# Patient Record
Sex: Female | Born: 1980
Health system: Southern US, Community
[De-identification: ages and names within clinical notes are randomized; demographics above are authoritative.]

## PROBLEM LIST (undated history)

## (undated) DIAGNOSIS — F1120 Opioid dependence, uncomplicated: Secondary | ICD-10-CM

## (undated) DIAGNOSIS — H9192 Unspecified hearing loss, left ear: Secondary | ICD-10-CM

## (undated) DIAGNOSIS — F1220 Cannabis dependence, uncomplicated: Secondary | ICD-10-CM

## (undated) DIAGNOSIS — E785 Hyperlipidemia, unspecified: Secondary | ICD-10-CM

## (undated) DIAGNOSIS — M545 Low back pain, unspecified: Secondary | ICD-10-CM

## (undated) DIAGNOSIS — N200 Calculus of kidney: Secondary | ICD-10-CM

## (undated) DIAGNOSIS — I10 Essential (primary) hypertension: Secondary | ICD-10-CM

## (undated) DIAGNOSIS — H6690 Otitis media, unspecified, unspecified ear: Secondary | ICD-10-CM

## (undated) DIAGNOSIS — B1920 Unspecified viral hepatitis C without hepatic coma: Secondary | ICD-10-CM

## (undated) DIAGNOSIS — G8929 Other chronic pain: Secondary | ICD-10-CM

## (undated) HISTORY — PX: ABDOMINAL HYSTERECTOMY: SHX81

## (undated) HISTORY — PX: DILATION AND CURETTAGE OF UTERUS: SHX78

## (undated) HISTORY — PX: TUBAL LIGATION: SHX77

## (undated) HISTORY — DX: Hyperlipidemia, unspecified: E78.5

## (undated) HISTORY — DX: Essential (primary) hypertension: I10

---

## 1999-06-12 ENCOUNTER — Encounter: Admission: RE | Admit: 1999-06-12 | Discharge: 1999-09-10 | Payer: Self-pay | Admitting: Obstetrics & Gynecology

## 1999-07-20 ENCOUNTER — Inpatient Hospital Stay (HOSPITAL_COMMUNITY): Admission: AD | Admit: 1999-07-20 | Discharge: 1999-07-20 | Payer: Self-pay | Admitting: Obstetrics and Gynecology

## 1999-07-21 ENCOUNTER — Inpatient Hospital Stay (HOSPITAL_COMMUNITY): Admission: AD | Admit: 1999-07-21 | Discharge: 1999-07-21 | Payer: Self-pay | Admitting: Obstetrics & Gynecology

## 1999-07-26 ENCOUNTER — Inpatient Hospital Stay (HOSPITAL_COMMUNITY): Admission: AD | Admit: 1999-07-26 | Discharge: 1999-07-26 | Payer: Self-pay | Admitting: Obstetrics & Gynecology

## 1999-08-04 ENCOUNTER — Inpatient Hospital Stay (HOSPITAL_COMMUNITY): Admission: AD | Admit: 1999-08-04 | Discharge: 1999-08-04 | Payer: Self-pay | Admitting: Family Medicine

## 1999-08-05 ENCOUNTER — Inpatient Hospital Stay (HOSPITAL_COMMUNITY): Admission: AD | Admit: 1999-08-05 | Discharge: 1999-08-05 | Payer: Self-pay | Admitting: Obstetrics and Gynecology

## 1999-08-16 ENCOUNTER — Inpatient Hospital Stay (HOSPITAL_COMMUNITY): Admission: AD | Admit: 1999-08-16 | Discharge: 1999-08-16 | Payer: Self-pay | Admitting: Obstetrics and Gynecology

## 1999-08-19 ENCOUNTER — Inpatient Hospital Stay (HOSPITAL_COMMUNITY): Admission: AD | Admit: 1999-08-19 | Discharge: 1999-08-22 | Payer: Self-pay | Admitting: Obstetrics & Gynecology

## 2010-03-26 ENCOUNTER — Emergency Department (HOSPITAL_BASED_OUTPATIENT_CLINIC_OR_DEPARTMENT_OTHER): Admission: EM | Admit: 2010-03-26 | Discharge: 2010-03-26 | Payer: Self-pay | Admitting: Emergency Medicine

## 2010-07-19 ENCOUNTER — Emergency Department (HOSPITAL_BASED_OUTPATIENT_CLINIC_OR_DEPARTMENT_OTHER): Admission: EM | Admit: 2010-07-19 | Discharge: 2010-07-19 | Payer: Self-pay | Admitting: Emergency Medicine

## 2010-09-12 ENCOUNTER — Ambulatory Visit: Payer: Self-pay | Admitting: Diagnostic Radiology

## 2010-09-12 ENCOUNTER — Emergency Department (HOSPITAL_BASED_OUTPATIENT_CLINIC_OR_DEPARTMENT_OTHER): Admission: EM | Admit: 2010-09-12 | Discharge: 2010-09-12 | Payer: Self-pay | Admitting: Emergency Medicine

## 2011-02-10 LAB — URINALYSIS, ROUTINE W REFLEX MICROSCOPIC
Glucose, UA: NEGATIVE mg/dL
Ketones, ur: NEGATIVE mg/dL
Protein, ur: 30 mg/dL — AB
pH: 6 (ref 5.0–8.0)

## 2011-02-10 LAB — CBC
Platelets: 200 10*3/uL (ref 150–400)
RBC: 4.29 MIL/uL (ref 3.87–5.11)
RDW: 12.8 % (ref 11.5–15.5)
WBC: 12.5 10*3/uL — ABNORMAL HIGH (ref 4.0–10.5)

## 2011-02-10 LAB — BASIC METABOLIC PANEL
BUN: 9 mg/dL (ref 6–23)
Creatinine, Ser: 0.7 mg/dL (ref 0.4–1.2)
GFR calc Af Amer: >60 mL/min (ref >60)
GFR calc non Af Amer: >60 mL/min (ref >60)
Potassium: 3.8 mEq/L (ref 3.5–5.1)

## 2011-02-10 LAB — URINE MICROSCOPIC-ADD ON

## 2011-02-10 LAB — DIFFERENTIAL
Basophils Absolute: 0.1 10*3/uL (ref 0.0–0.1)
Lymphocytes Relative: 24 % (ref 12–46)
Lymphs Abs: 3 10*3/uL (ref 0.7–4.0)
Neutro Abs: 8.6 10*3/uL — ABNORMAL HIGH (ref 1.7–7.7)

## 2011-02-12 ENCOUNTER — Emergency Department (HOSPITAL_BASED_OUTPATIENT_CLINIC_OR_DEPARTMENT_OTHER)
Admission: EM | Admit: 2011-02-12 | Discharge: 2011-02-12 | Disposition: A | Discharge status: Home / Self Care | Payer: Self-pay | Attending: Emergency Medicine | Admitting: Emergency Medicine

## 2011-02-12 DIAGNOSIS — J45909 Unspecified asthma, uncomplicated: Secondary | ICD-10-CM | POA: Insufficient documentation

## 2011-02-12 DIAGNOSIS — IMO0002 Reserved for concepts with insufficient information to code with codable children: Secondary | ICD-10-CM | POA: Insufficient documentation

## 2011-02-12 DIAGNOSIS — X500XXA Overexertion from strenuous movement or load, initial encounter: Secondary | ICD-10-CM | POA: Insufficient documentation

## 2011-02-12 DIAGNOSIS — Y92009 Unspecified place in unspecified non-institutional (private) residence as the place of occurrence of the external cause: Secondary | ICD-10-CM | POA: Insufficient documentation

## 2011-03-11 ENCOUNTER — Emergency Department (HOSPITAL_BASED_OUTPATIENT_CLINIC_OR_DEPARTMENT_OTHER)
Admission: EM | Admit: 2011-03-11 | Discharge: 2011-03-11 | Disposition: A | Discharge status: Home / Self Care | Payer: Self-pay | Attending: Emergency Medicine | Admitting: Emergency Medicine

## 2011-03-11 DIAGNOSIS — J45909 Unspecified asthma, uncomplicated: Secondary | ICD-10-CM | POA: Insufficient documentation

## 2011-03-11 DIAGNOSIS — H60399 Other infective otitis externa, unspecified ear: Secondary | ICD-10-CM | POA: Insufficient documentation

## 2011-03-11 DIAGNOSIS — H9209 Otalgia, unspecified ear: Secondary | ICD-10-CM | POA: Insufficient documentation

## 2011-03-11 DIAGNOSIS — F17200 Nicotine dependence, unspecified, uncomplicated: Secondary | ICD-10-CM | POA: Insufficient documentation

## 2011-03-28 ENCOUNTER — Emergency Department (HOSPITAL_BASED_OUTPATIENT_CLINIC_OR_DEPARTMENT_OTHER)
Admission: EM | Admit: 2011-03-28 | Discharge: 2011-03-28 | Disposition: A | Discharge status: Home / Self Care | Payer: Self-pay | Attending: Emergency Medicine | Admitting: Emergency Medicine

## 2011-03-28 DIAGNOSIS — H9209 Otalgia, unspecified ear: Secondary | ICD-10-CM | POA: Insufficient documentation

## 2011-03-28 DIAGNOSIS — F17200 Nicotine dependence, unspecified, uncomplicated: Secondary | ICD-10-CM | POA: Insufficient documentation

## 2011-05-13 ENCOUNTER — Emergency Department (HOSPITAL_BASED_OUTPATIENT_CLINIC_OR_DEPARTMENT_OTHER)
Admission: EM | Admit: 2011-05-13 | Discharge: 2011-05-13 | Disposition: A | Discharge status: Home / Self Care | Payer: Self-pay | Attending: Emergency Medicine | Admitting: Emergency Medicine

## 2011-05-13 DIAGNOSIS — X503XXA Overexertion from repetitive movements, initial encounter: Secondary | ICD-10-CM | POA: Insufficient documentation

## 2011-05-13 DIAGNOSIS — F17200 Nicotine dependence, unspecified, uncomplicated: Secondary | ICD-10-CM | POA: Insufficient documentation

## 2011-05-13 DIAGNOSIS — Y93A9 Activity, other involving cardiorespiratory exercise: Secondary | ICD-10-CM | POA: Insufficient documentation

## 2011-05-13 DIAGNOSIS — S335XXA Sprain of ligaments of lumbar spine, initial encounter: Secondary | ICD-10-CM | POA: Insufficient documentation

## 2011-06-09 ENCOUNTER — Encounter: Payer: Self-pay | Admitting: Family Medicine

## 2011-06-09 ENCOUNTER — Emergency Department (HOSPITAL_BASED_OUTPATIENT_CLINIC_OR_DEPARTMENT_OTHER)
Admission: EM | Admit: 2011-06-09 | Discharge: 2011-06-09 | Disposition: A | Discharge status: Home / Self Care | Payer: Medicaid Other | Attending: Emergency Medicine | Admitting: Emergency Medicine

## 2011-06-09 DIAGNOSIS — J45909 Unspecified asthma, uncomplicated: Secondary | ICD-10-CM | POA: Insufficient documentation

## 2011-06-09 DIAGNOSIS — M545 Low back pain, unspecified: Secondary | ICD-10-CM | POA: Insufficient documentation

## 2011-06-09 DIAGNOSIS — F17200 Nicotine dependence, unspecified, uncomplicated: Secondary | ICD-10-CM | POA: Insufficient documentation

## 2011-06-09 MED ORDER — OXYCODONE-ACETAMINOPHEN 5-325 MG PO TABS: 1.0000 | ORAL_TABLET | ORAL | Status: AC | PRN

## 2011-06-09 MED ORDER — CYCLOBENZAPRINE HCL 10 MG PO TABS: 10.0000 mg | ORAL_TABLET | Freq: Three times a day (TID) | ORAL | Status: AC

## 2011-06-09 NOTE — ED Notes (Signed)
Pt sts she "injured low back pulling on basketball goal". Pt sts she has h/o same.

## 2011-06-09 NOTE — ED Provider Notes (Addendum)
History     Chief Complaint  Patient presents with  . Back Pain   HPI Comments: Pt was pulling a basketball goal last night and hurt her back.  She took ibuprofen and used a heating pad without relief.  She had hurt her back before a month and 1/2 ago.    Patient is a 30 y.o. female presenting with back pain. The history is provided by the patient. No language interpreter was used.  Back Pain  This is a new problem. The current episode started 12 to 24 hours ago. The problem occurs constantly. The problem has not changed since onset.The pain is associated with lifting heavy objects. The pain is present in the lumbar spine. The quality of the pain is described as stabbing. The pain does not radiate. The pain is moderate. The symptoms are aggravated by bending and twisting.    Past Medical History  Diagnosis Date  . Asthma     Past Surgical History  Procedure Date  . Dilation and curettage of uterus     No family history on file.  History  Substance Use Topics  . Smoking status: Current Everyday Smoker -- 1.0 packs/day    Types: Cigarettes  . Smokeless tobacco: Not on file  . Alcohol Use: Yes    OB History    Grav Para Term Preterm Abortions TAB SAB Ect Mult Living                  Review of Systems  Constitutional: Negative.   HENT: Negative.   Eyes: Negative.   Respiratory: Negative.   Cardiovascular: Negative.   Gastrointestinal: Negative.   Genitourinary: Negative.   Musculoskeletal: Positive for back pain.  Skin: Negative.   Neurological: Negative.     Physical Exam  BP 118/71  Pulse 67  Temp(Src) 98.3 F (36.8 C) (Oral)  Ht 5\' 6"  (1.676 m)  Wt 145 lb (65.772 kg)  BMI 23.40 kg/m2  SpO2 99%  Physical Exam  Constitutional: She is oriented to person, place, and time. She appears well-developed. She appears distressed (in moderate distress with back pain localized to the left upper lumbar paraspinous muscles.).  HENT:  Head: Normocephalic and  atraumatic.  Eyes: Conjunctivae and EOM are normal. Pupils are equal, round, and reactive to light.  Neck: Normal range of motion. Neck supple.  Cardiovascular: Normal rate and regular rhythm.   Pulmonary/Chest: Effort normal and breath sounds normal.  Abdominal: Soft. Bowel sounds are normal.  Musculoskeletal:       Pain localized to left lumbar paraspinous muscles.  There is no palpable bony deformity and no sensory change.  Neurological: She is alert and oriented to person, place, and time.  Skin: Skin is warm and dry.  Psychiatric: She has a normal mood and affect. Her behavior is normal.    ED Course  Procedures  MDM  Mechanism of injury did not suggest a fracture, so no imaging was ordered. Rx Percocet for pain and Flexeril for muscle spasm.       Carleene Cooper III, MD 06/09/11 8119  Carleene Cooper III, MD 06/09/11 2135

## 2011-06-18 ENCOUNTER — Emergency Department (HOSPITAL_BASED_OUTPATIENT_CLINIC_OR_DEPARTMENT_OTHER)
Admission: EM | Admit: 2011-06-18 | Discharge: 2011-06-18 | Disposition: A | Discharge status: Home / Self Care | Payer: Medicaid Other | Attending: Emergency Medicine | Admitting: Emergency Medicine

## 2011-06-18 ENCOUNTER — Encounter (HOSPITAL_BASED_OUTPATIENT_CLINIC_OR_DEPARTMENT_OTHER): Payer: Self-pay | Admitting: *Deleted

## 2011-06-18 DIAGNOSIS — Z79899 Other long term (current) drug therapy: Secondary | ICD-10-CM | POA: Insufficient documentation

## 2011-06-18 DIAGNOSIS — J45909 Unspecified asthma, uncomplicated: Secondary | ICD-10-CM | POA: Insufficient documentation

## 2011-06-18 DIAGNOSIS — S39012A Strain of muscle, fascia and tendon of lower back, initial encounter: Secondary | ICD-10-CM

## 2011-06-18 DIAGNOSIS — F17200 Nicotine dependence, unspecified, uncomplicated: Secondary | ICD-10-CM | POA: Insufficient documentation

## 2011-06-18 DIAGNOSIS — S335XXA Sprain of ligaments of lumbar spine, initial encounter: Secondary | ICD-10-CM | POA: Insufficient documentation

## 2011-06-18 DIAGNOSIS — X503XXA Overexertion from repetitive movements, initial encounter: Secondary | ICD-10-CM | POA: Insufficient documentation

## 2011-06-18 MED ORDER — CYCLOBENZAPRINE HCL 5 MG PO TABS: 5.0000 mg | ORAL_TABLET | Freq: Three times a day (TID) | ORAL | Status: AC | PRN

## 2011-06-18 MED ORDER — OXYCODONE-ACETAMINOPHEN 5-325 MG PO TABS: 1.0000 | ORAL_TABLET | Freq: Four times a day (QID) | ORAL | Status: AC | PRN

## 2011-06-18 NOTE — ED Provider Notes (Signed)
History    History of Present Illness   Patient Identification Jennifer Ballard is a 30 y.o. female.  Patient information was obtained from patient. History/Exam limitations: none. Patient presented to the Emergency Department by private vehicle.  Chief Complaint  Back Pain   Patient presents with complaint of back pain. This is a result of lifting a heavy object. Onset of pain was 1 day ago and has been gradually worsening since. The pain is located in left lower back, described as aching, sharp and throbbing and rated as moderate, without radiation. Symptoms include no other symptoms. The patient also complains of no other complaints. The patient denies weakness, numbness, incontinence, dysuria, hematuria, fever, recent steroid use, leg weakness, perianal numbness. The patient denies other injuries. Care prior to arrival consisted of NSAID with no relief. 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  Was seen earlier this month for similar pain which had been improving on percocet/flexeril until exacerbated yesterday.  No fall or other acute injury.   Past Medical History  Diagnosis Date  . Asthma    No family history on file. No current facility-administered medications for this encounter.   Current Outpatient Prescriptions  Medication Sig Dispense Refill  . ibuprofen (ADVIL,MOTRIN) 600 MG tablet Take 600 mg by mouth every 6 (six) hours as needed. Pain back      . loratadine (CLARITIN) 10 MG tablet Take 10 mg by mouth daily. allergies      . cyclobenzaprine (FLEXERIL) 10 MG tablet Take 1 tablet (10 mg total) by mouth 3 (three) times daily.  15 tablet  0  . oxyCODONE-acetaminophen (PERCOCET) 5-325 MG per tablet Take 1 tablet by mouth every 4 (four) hours as needed for pain.  20 tablet  0   Allergies  Allergen Reactions  . Amoxicillin Nausea And Vomiting  . Penicillins Nausea And Vomiting   History    Social History  . Marital Status: Married    Spouse Name: N/A    Number of Children: N/A  . Years of Education: N/A   Occupational History  . Not on file.   Social History Main Topics  . Smoking status: Current Everyday Smoker -- 1.0 packs/day    Types: Cigarettes  . Smokeless tobacco: Not on file  . Alcohol Use: Yes  . Drug Use: No  . Sexually Active: Yes    Birth Control/ Protection: None   Other Topics Concern  . Not on file   Social History Narrative  . No narrative on file   Review of Systems Pertinent items are noted in HPI.  10 Systems reviewed and are negative for acute change except as noted in the HPI. Physical Exam   BP 127/83  Pulse 64  Temp(Src) 98.2 F (36.8 C) (Oral)  Resp 16  Ht 5\' 6"  (1.676 m)  Wt 145 lb (65.772 kg)  BMI 23.40 kg/m2  SpO2 100%  LMP 05/31/2011 BP 127/83  Pulse 64  Temp(Src) 98.2 F (36.8 C) (Oral)  Resp 16  Ht 5\' 6"  (1.676 m)  Wt 145 lb (65.772 kg)  BMI 23.40 kg/m2  SpO2 100%  LMP 05/31/2011 General appearance: alert, cooperative and mild distress Head: Normocephalic, without obvious abnormality, atraumatic Eyes: negative, normal appearance Neck: supple, symmetrical, trachea midline and FROM Back: no skin lesions, erythema, or scars, range of motion normal, no midline thoracic or lumbar tenderness, left paraspinal tenderness to palpation Lungs: clear  to auscultation bilaterally Heart: regular rate and rhythm, S1, S2 normal, no murmur, click, rub or gallop Abdomen: soft, non-tender; bowel sounds normal; no masses,  no organomegaly Extremities: extremities normal, atraumatic, no cyanosis or edema Pulses: 2+ and symmetric Skin: Skin color, texture, turgor normal. No rashes or lesions Neurologic: Motor: 5/5 in extremities x 4, sensation intact in ext x 4  ED Course   Studies: None indicated.  Records Reviewed: Old medical records. Nursing notes.  Treatments: percocet, flexeril prescribed- advised to continue  ibuprofen   Disposition: Home Nonsteroidals, Narcotic pain medication and Muscle relaxants  Chief Complaint  Patient presents with  . Back Pain   HPI  Past Medical History  Diagnosis Date  . Asthma     Past Surgical History  Procedure Date  . Dilation and curettage of uterus     No family history on file.  History  Substance Use Topics  . Smoking status: Current Everyday Smoker -- 1.0 packs/day    Types: Cigarettes  . Smokeless tobacco: Not on file  . Alcohol Use: Yes    OB History    Grav Para Term Preterm Abortions TAB SAB Ect Mult Living                  Review of Systems  Physical Exam  BP 127/83  Pulse 64  Temp(Src) 98.2 F (36.8 C) (Oral)  Resp 16  Ht 5\' 6"  (1.676 m)  Wt 145 lb (65.772 kg)  BMI 23.40 kg/m2  SpO2 100%  LMP 05/31/2011  Physical Exam  ED Course  Procedures  MDM Pt with left lumbar pain and soreness after lifting children yesterday.  No midline tenderness, no weakness in legs, no fever, no signs/symptoms of cauda equina.  No urinary symptoms.  Discharged with strict return precautions.  Pt agreeable with plan.

## 2011-06-18 NOTE — ED Notes (Signed)
Pt reports seen 3 weeks ago here by MD Ignacia Palma for same symptoms. Yesterday states reinjured back by picking up 31 year old child. Pain in lower back on left side.

## 2011-06-22 ENCOUNTER — Encounter (HOSPITAL_BASED_OUTPATIENT_CLINIC_OR_DEPARTMENT_OTHER): Payer: Self-pay | Admitting: *Deleted

## 2011-06-22 ENCOUNTER — Emergency Department (HOSPITAL_BASED_OUTPATIENT_CLINIC_OR_DEPARTMENT_OTHER)
Admission: EM | Admit: 2011-06-22 | Discharge: 2011-06-22 | Disposition: A | Discharge status: Home / Self Care | Payer: Self-pay | Attending: Emergency Medicine | Admitting: Emergency Medicine

## 2011-06-22 DIAGNOSIS — H9209 Otalgia, unspecified ear: Secondary | ICD-10-CM | POA: Insufficient documentation

## 2011-06-22 DIAGNOSIS — J45909 Unspecified asthma, uncomplicated: Secondary | ICD-10-CM | POA: Insufficient documentation

## 2011-06-22 DIAGNOSIS — H60399 Other infective otitis externa, unspecified ear: Secondary | ICD-10-CM | POA: Insufficient documentation

## 2011-06-22 DIAGNOSIS — F17200 Nicotine dependence, unspecified, uncomplicated: Secondary | ICD-10-CM | POA: Insufficient documentation

## 2011-06-22 MED ORDER — NEOMYCIN-POLYMYXIN-HC 3.5-10000-1 OT SUSP: 4.0000 [drp] | Freq: Three times a day (TID) | OTIC | Status: AC

## 2011-06-22 MED ORDER — HYDROCODONE-ACETAMINOPHEN 5-500 MG PO TABS: 1.0000 | ORAL_TABLET | Freq: Four times a day (QID) | ORAL | Status: AC | PRN

## 2011-06-22 NOTE — ED Notes (Signed)
R TM red raised no drainage in canal noted

## 2011-06-22 NOTE — ED Provider Notes (Signed)
History     Chief Complaint  Patient presents with  . Otalgia   Patient is a 30 y.o. female presenting with ear pain. The history is provided by the patient.  Otalgia This is a recurrent problem. The current episode started yesterday. There is pain in the right ear. The problem occurs constantly. The problem has been rapidly worsening. There has been no fever. The pain is at a severity of 10/10. Pertinent negatives include no ear discharge, no headaches, no hearing loss, no rhinorrhea and no sore throat.    Past Medical History  Diagnosis Date  . Asthma     Past Surgical History  Procedure Date  . Dilation and curettage of uterus     No family history on file.  History  Substance Use Topics  . Smoking status: Current Everyday Smoker -- 1.0 packs/day    Types: Cigarettes  . Smokeless tobacco: Not on file  . Alcohol Use: Yes    OB History    Grav Para Term Preterm Abortions TAB SAB Ect Mult Living                  Review of Systems  Constitutional: Negative for fever and chills.  HENT: Positive for ear pain. Negative for hearing loss, sore throat, rhinorrhea and ear discharge.   Neurological: Negative for headaches.  All other systems reviewed and are negative.    Physical Exam  BP 119/87  Pulse 90  Temp(Src) 99.1 F (37.3 C) (Oral)  Resp 20  Ht 5\' 6"  (1.676 m)  Wt 145 lb (65.772 kg)  BMI 23.40 kg/m2  SpO2 99%  LMP 05/31/2011  Physical Exam  Constitutional: She appears well-developed and well-nourished. She appears distressed.  HENT:  Head: Normocephalic and atraumatic.  Left Ear: External ear normal.       The right ear is very uncomfortable with manipulation of the pinna, insertion of otoscope.  There is moderate erythema.  The TM appears normal.  Neck: Normal range of motion.  Cardiovascular: Normal rate and regular rhythm.  Exam reveals no gallop and no friction rub.   No murmur heard. Pulmonary/Chest: Effort normal and breath sounds normal.    Lymphadenopathy:    She has no cervical adenopathy.    ED Course  Procedures  MDM Patient here for same before, back pain also.  Will treat with cortisporin, pain meds.      Riley Lam [facility] 06/22/11 1207

## 2011-06-22 NOTE — ED Notes (Signed)
C/o pain in right ear that started today while driving.  She says she felt a "pop" and is now not able to hear out of the right ear.

## 2011-06-22 NOTE — ED Notes (Signed)
Family at bedside. 

## 2011-07-17 ENCOUNTER — Encounter (HOSPITAL_BASED_OUTPATIENT_CLINIC_OR_DEPARTMENT_OTHER): Payer: Self-pay | Admitting: Emergency Medicine

## 2011-07-17 ENCOUNTER — Emergency Department (HOSPITAL_BASED_OUTPATIENT_CLINIC_OR_DEPARTMENT_OTHER)
Admission: EM | Admit: 2011-07-17 | Discharge: 2011-07-17 | Disposition: A | Discharge status: Home / Self Care | Payer: Medicaid Other | Attending: Emergency Medicine | Admitting: Emergency Medicine

## 2011-07-17 DIAGNOSIS — J45909 Unspecified asthma, uncomplicated: Secondary | ICD-10-CM | POA: Insufficient documentation

## 2011-07-17 DIAGNOSIS — M549 Dorsalgia, unspecified: Secondary | ICD-10-CM | POA: Insufficient documentation

## 2011-07-17 DIAGNOSIS — G8929 Other chronic pain: Secondary | ICD-10-CM | POA: Insufficient documentation

## 2011-07-17 DIAGNOSIS — F17200 Nicotine dependence, unspecified, uncomplicated: Secondary | ICD-10-CM | POA: Insufficient documentation

## 2011-07-17 HISTORY — DX: Other chronic pain: G89.29

## 2011-07-17 HISTORY — DX: Low back pain: M54.5

## 2011-07-17 HISTORY — DX: Low back pain, unspecified: M54.50

## 2011-07-17 NOTE — ED Notes (Signed)
Pt states she was picking up luggage yesterday.  Pt states lower back started to hurt last night, worse this am.  No numbness/tingling in feet.  No bowel/bladder dysfunction.

## 2011-07-18 NOTE — ED Provider Notes (Signed)
History     CSN: [id number] Arrival date & time: 07/17/2011 10:21 AM  Chief Complaint  Patient presents with  . Back Pain   HPI This is a 30 year old female who comes in today complaining of back pain occurred after the lifting suitcases while leaving the beach yesterday. She denies any fall or direct trauma. It is increased with any movement. She does have a history of chronic low back pain. She denies any numbness or tingling or weakness. She denies any loss of bowel or bladder control. She states she has used ibuprofen without any relief. Past Medical History  Diagnosis Date  . Asthma   . Chronic lower back pain    Reviewed. Past Surgical History  Procedure Date  . Dilation and curettage of uterus     History reviewed. No pertinent family history.  History  Substance Use Topics  . Smoking status: Current Everyday Smoker -- 1.0 packs/day    Types: Cigarettes  . Smokeless tobacco: Not on file  . Alcohol Use: Yes   Social history reviewed and significant for smoking one pack per day. OB History    Grav Para Term Preterm Abortions TAB SAB Ect Mult Living                  Review of Systems  All other systems reviewed and are negative.    Physical Exam  BP 119/79  Pulse 79  Temp(Src) 98.1 F (36.7 C) (Oral)  Resp 20  Wt 145 lb (65.772 kg)  SpO2 99%  LMP 07/13/2011  Physical Exam Physical exam this is a well-developed well-nourished female [place] who does not appear to be any acute distress although she does have her hand placed in the lower low back area. Vital signs are normal. HEENT normocephalic atraumatic pupils equal round react to light extraocular movements are intact external ears are normal Neck is reveals no signs of trauma. There is no tenderness palpable over the cervical spine. Full active range of motion. Chest wall is normal. Lungs are clear to auscultation. Heart is regular rate and rhythm. Abdomen is soft and nontender. Back reveals no  external signs of trauma. Lumbar spine is slightly tender with tenderness palpable paraspinally on the right. Extremities no signs of trauma no abnormalities noted Neurologically patient is alert and oriented x3 strength is equal throughout. Deep tendon reflexes are equal throughout sensation is intact.  ED Course  Procedures  MDM I discussed using anti-inflammatories and muscle relaxants with the patient. The patient was to be discharged but had left prior to the nurse returning with discharge papers.      Hilario Quarry, MD 07/18/11 [phone]

## 2011-08-18 ENCOUNTER — Encounter (HOSPITAL_BASED_OUTPATIENT_CLINIC_OR_DEPARTMENT_OTHER): Payer: Self-pay | Admitting: Family Medicine

## 2011-08-18 ENCOUNTER — Emergency Department (HOSPITAL_BASED_OUTPATIENT_CLINIC_OR_DEPARTMENT_OTHER)
Admission: EM | Admit: 2011-08-18 | Discharge: 2011-08-18 | Disposition: A | Discharge status: Home / Self Care | Payer: Medicaid Other | Attending: Emergency Medicine | Admitting: Emergency Medicine

## 2011-08-18 DIAGNOSIS — H60399 Other infective otitis externa, unspecified ear: Secondary | ICD-10-CM | POA: Insufficient documentation

## 2011-08-18 DIAGNOSIS — J45909 Unspecified asthma, uncomplicated: Secondary | ICD-10-CM | POA: Insufficient documentation

## 2011-08-18 DIAGNOSIS — F17200 Nicotine dependence, unspecified, uncomplicated: Secondary | ICD-10-CM | POA: Insufficient documentation

## 2011-08-18 DIAGNOSIS — H9209 Otalgia, unspecified ear: Secondary | ICD-10-CM | POA: Insufficient documentation

## 2011-08-18 HISTORY — DX: Otitis media, unspecified, unspecified ear: H66.90

## 2011-08-18 MED ORDER — NEOMYCIN-POLYMYXIN-HC 3.5-10000-1 OT SUSP: 4.0000 [drp] | Freq: Three times a day (TID) | OTIC | Status: AC

## 2011-08-18 NOTE — ED Provider Notes (Signed)
History     CSN: [id number] Arrival date & time: 08/18/2011  9:29 AM   Chief Complaint  Patient presents with  . Otalgia     (Include location/radiation/quality/duration/timing/severity/associated sxs/prior treatment) Patient is a 30 y.o. female presenting with ear pain. The history is provided by the patient.  Otalgia   Patient here with right ear pain for 3 days. History of similar symptoms in the past and seen here for same diagnosed with otitis externa. Low-grade temp noted per patient at home. Denies neck pain rash some headache noted. No meds taken prior to arrival  Past Medical History  Diagnosis Date  . Asthma   . Chronic lower back pain   . Chronic ear infection      Past Surgical History  Procedure Date  . Dilation and curettage of uterus     No family history on file.  History  Substance Use Topics  . Smoking status: Current Everyday Smoker -- 1.0 packs/day    Types: Cigarettes  . Smokeless tobacco: Not on file  . Alcohol Use: Yes    OB History    Grav Para Term Preterm Abortions TAB SAB Ect Mult Living                  Review of Systems  HENT: Positive for ear pain.   All other systems reviewed and are negative.    Allergies  Amoxicillin and Penicillins  Home Medications   Current Outpatient Rx  Name Route Sig Dispense Refill  . IBUPROFEN 600 MG PO TABS Oral Take 600 mg by mouth every 6 (six) hours as needed. Pain back    . LORATADINE 10 MG PO TABS Oral Take 10 mg by mouth daily. allergies      Physical Exam    BP 114/64  Pulse 67  Temp(Src) 98.2 F (36.8 C) (Oral)  Resp 16  Ht 5\' 6"  (1.676 m)  Wt 145 lb (65.772 kg)  BMI 23.40 kg/m2  SpO2 99%  LMP 08/04/2011  Physical Exam  Nursing note and vitals reviewed. Constitutional: She is oriented to person, place, and time. She appears well-developed and well-nourished.  Non-toxic appearance.  HENT:  Head: Normocephalic and atraumatic.  Right Ear: There is drainage and tenderness.  No foreign bodies. No mastoid tenderness. Tympanic membrane is not erythematous.  Left Ear: Hearing, tympanic membrane, external ear and ear canal normal.  Eyes: Conjunctivae are normal. Pupils are equal, round, and reactive to light.  Neck: Normal range of motion.  Cardiovascular: Normal rate.   Pulmonary/Chest: Effort normal.  Neurological: She is alert and oriented to person, place, and time.  Skin: Skin is warm and dry.  Psychiatric: She has a normal mood and affect.    ED Course  Procedures  Results for orders placed during the hospital encounter of 09/12/10  PREGNANCY, URINE      Component Value Range   Preg Test, Ur       Value: NEGATIVE            THE SENSITIVITY OF THIS     METHODOLOGY IS >24 mIU/mL  URINALYSIS, ROUTINE W REFLEX MICROSCOPIC      Component Value Range   Color, Urine RED BIOCHEMICALS MAY BE AFFECTED BY COLOR (*) YELLOW    Appearance CLOUDY (*) CLEAR    Specific Gravity, Urine 1.019  1.005 - 1.030    pH 6.0  5.0 - 8.0    Glucose, UA NEGATIVE  NEGATIVE (mg/dL)   Hgb urine dipstick LARGE (*) NEGATIVE  Bilirubin Urine NEGATIVE  NEGATIVE    Ketones, ur NEGATIVE  NEGATIVE (mg/dL)   Protein, ur 30 (*) NEGATIVE (mg/dL)   Urobilinogen, UA 0.2  0.0 - 1.0 (mg/dL)   Nitrite NEGATIVE  NEGATIVE    Leukocytes, UA SMALL (*) NEGATIVE   URINE MICROSCOPIC-ADD ON      Component Value Range   Squamous Epithelial / LPF RARE  RARE    WBC, UA 7-10  <3 (WBC/hpf)   RBC / HPF TOO NUMEROUS TO COUNT  <3 (RBC/hpf)   Bacteria, UA MANY (*) RARE   BASIC METABOLIC PANEL      Component Value Range   Sodium 143  135 - 145 (mEq/L)   Potassium 3.8  3.5 - 5.1 (mEq/L)   Chloride 106  96 - 112 (mEq/L)   CO2 29  19 - 32 (mEq/L)   Glucose, Bld 105 (*) 70 - 99 (mg/dL)   BUN 9  6 - 23 (mg/dL)   Creatinine, Ser .7  0.4 - 1.2 (mg/dL)   Calcium 9.5  8.4 - 16.1 (mg/dL)   GFR calc non Af Amer >60  >60 (mL/min)   GFR calc Af Amer    >60 (mL/min)   Value: >60            The eGFR has been  calculated     using the MDRD equation.     This calculation has not been     validated in all clinical     situations.     eGFR's persistently     <60 mL/min signify     possible Chronic Kidney Disease.  CBC      Component Value Range   WBC 12.5 (*) 4.0 - 10.5 (K/uL)   RBC 4.29  3.87 - 5.11 (MIL/uL)   Hemoglobin 13.6  12.0 - 15.0 (g/dL)   HCT 09.6  04.5 - 40.9 (%)   MCV 92.0  78.0 - 100.0 (fL)   MCH 31.6  26.0 - 34.0 (pg)   MCHC 34.4  30.0 - 36.0 (g/dL)   RDW 81.1  91.4 - 78.2 (%)   Platelets 200  150 - 400 (K/uL)  DIFFERENTIAL      Component Value Range   Neutrophils Relative 69  43 - 77 (%)   Neutro Abs 8.6 (*) 1.7 - 7.7 (K/uL)   Lymphocytes Relative 24  12 - 46 (%)   Lymphs Abs 3.0  0.7 - 4.0 (K/uL)   Monocytes Relative 5  3 - 12 (%)   Monocytes Absolute 0.6  0.1 - 1.0 (K/uL)   Eosinophils Relative 2  0 - 5 (%)   Eosinophils Absolute 0.2  0.0 - 0.7 (K/uL)   Basophils Relative 1  0 - 1 (%)   Basophils Absolute 0.1  0.0 - 0.1 (K/uL)   No results found.   No diagnosis found.   MDM  Will treat patient for otitis externa.       Toy Baker, MD 08/18/11 [phone]

## 2011-08-18 NOTE — ED Notes (Signed)
Pt c/o "right ear pain and infection" x 2 days. 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 Pt reports fever since last night and taking ibuprofen at home. Pt temp is currently is 98.2 orally.

## 2011-12-02 ENCOUNTER — Encounter (HOSPITAL_BASED_OUTPATIENT_CLINIC_OR_DEPARTMENT_OTHER): Payer: Self-pay | Admitting: *Deleted

## 2011-12-02 ENCOUNTER — Emergency Department (HOSPITAL_BASED_OUTPATIENT_CLINIC_OR_DEPARTMENT_OTHER)
Admission: EM | Admit: 2011-12-02 | Discharge: 2011-12-02 | Disposition: A | Discharge status: Home / Self Care | Payer: Medicaid Other | Attending: Emergency Medicine | Admitting: Emergency Medicine

## 2011-12-02 DIAGNOSIS — X500XXA Overexertion from strenuous movement or load, initial encounter: Secondary | ICD-10-CM | POA: Insufficient documentation

## 2011-12-02 DIAGNOSIS — J069 Acute upper respiratory infection, unspecified: Secondary | ICD-10-CM

## 2011-12-02 DIAGNOSIS — IMO0002 Reserved for concepts with insufficient information to code with codable children: Secondary | ICD-10-CM | POA: Insufficient documentation

## 2011-12-02 DIAGNOSIS — F17200 Nicotine dependence, unspecified, uncomplicated: Secondary | ICD-10-CM | POA: Insufficient documentation

## 2011-12-02 DIAGNOSIS — T148XXA Other injury of unspecified body region, initial encounter: Secondary | ICD-10-CM

## 2011-12-02 HISTORY — DX: Unspecified hearing loss, left ear: H91.92

## 2011-12-02 HISTORY — DX: Calculus of kidney: N20.0

## 2011-12-02 MED ORDER — BENZONATATE 200 MG PO CAPS: 200.0000 mg | ORAL_CAPSULE | Freq: Three times a day (TID) | ORAL | Status: AC | PRN

## 2011-12-02 MED ORDER — HYDROCODONE-ACETAMINOPHEN 5-325 MG PO TABS: 2.0000 | ORAL_TABLET | Freq: Four times a day (QID) | ORAL | Status: AC | PRN

## 2011-12-02 MED ORDER — KETOROLAC TROMETHAMINE 60 MG/2ML IM SOLN
60.0000 mg | Freq: Once | INTRAMUSCULAR | Status: AC
  Administered 2011-12-02: 60 mg via INTRAMUSCULAR
  Filled 2011-12-02: qty 2

## 2011-12-02 MED ORDER — BENZONATATE 100 MG PO CAPS
200.0000 mg | ORAL_CAPSULE | Freq: Once | ORAL | Status: AC
  Administered 2011-12-02: 200 mg via ORAL
  Filled 2011-12-02: qty 2

## 2011-12-02 MED ORDER — CYCLOBENZAPRINE HCL 10 MG PO TABS: 10.0000 mg | ORAL_TABLET | Freq: Two times a day (BID) | ORAL | Status: AC | PRN

## 2011-12-02 NOTE — ED Provider Notes (Signed)
History     CSN: [id number]  Arrival date & time 12/02/11  0902   First MD Initiated Contact with Patient 12/02/11 0915      Chief Complaint  Patient presents with  . Back Pain    (Consider location/radiation/quality/duration/timing/severity/associated sxs/prior treatment) HPI Patient is a 31 yo F who was lifting a garage door yesterday when she pulled a muscle in her left side.  Now she has 10/10 pain here whenever she moves.  She was unable to sleep last night and can not find anything that improves this.  This is a sharp spasming pain.  There are no other associated or modifying factors. Past Medical History  Diagnosis Date  . Asthma   . Chronic lower back pain   . Chronic ear infection   . Kidney stones   . Hearing loss in left ear     Past Surgical History  Procedure Date  . Dilation and curettage of uterus   . Tubal ligation     No family history on file.  History  Substance Use Topics  . Smoking status: Current Everyday Smoker -- 1.0 packs/day for 15 years    Types: Cigarettes  . Smokeless tobacco: Not on file  . Alcohol Use: Yes     occassionally    OB History    Grav Para Term Preterm Abortions TAB SAB Ect Mult Living                  Review of Systems  Constitutional: Negative.   HENT: Negative.   Eyes: Negative.   Respiratory: Negative.   Cardiovascular: Negative.   Gastrointestinal: Negative.   Genitourinary: Negative.   Musculoskeletal: Negative.        Right-sided muscular pain  Skin: Negative.   Neurological: Negative.   Hematological: Negative.   Psychiatric/Behavioral: Negative.   All other systems reviewed and are negative.    Allergies  Amoxicillin and Penicillins  Home Medications   Current Outpatient Rx  Name Route Sig Dispense Refill  . GUAIFENESIN ER 600 MG PO TB12 Oral Take 1,200 mg by mouth 2 (two) times daily.      . IBUPROFEN 600 MG PO TABS Oral Take 600 mg by mouth every 6 (six) hours as needed. Pain back    .  LORATADINE 10 MG PO TABS Oral Take 10 mg by mouth daily. allergies      BP 126/76  Pulse 81  Temp(Src) 97.7 F (36.5 C) (Oral)  Resp 20  Ht 5\' 6"  (1.676 m)  Wt 145 lb (65.772 kg)  BMI 23.40 kg/m2  SpO2 98%  LMP 11/24/2011  Physical Exam  Nursing note and vitals reviewed. Constitutional: She is oriented to person, place, and time. She appears well-developed and well-nourished. No distress.  HENT:  Head: Normocephalic and atraumatic.  Eyes: Conjunctivae and EOM are normal. Pupils are equal, round, and reactive to light.  Neck: Normal range of motion.  Cardiovascular: Normal rate, regular rhythm, normal heart sounds and intact distal pulses.  Exam reveals no gallop and no friction rub.   No murmur heard. Pulmonary/Chest: Effort normal and breath sounds normal. No respiratory distress. She has no wheezes. She has no rales.  Abdominal: Soft. Bowel sounds are normal. She exhibits no distension. There is no tenderness. There is no rebound.  Musculoskeletal:       TTP over left lateral chest wall with limited ROM in trunk due to this  Neurological: She is alert and oriented to person, place, and time. No cranial  nerve deficit.  Skin: Skin is warm and dry. No rash noted.  Psychiatric: She has a normal mood and affect.    ED Course  Procedures (including critical care time)  Labs Reviewed - No data to display No results found.   1. Acute URI   2. Muscle strain       MDM  History and physical exam were consistent with muscular strain.  No studies were required.  Patient was treated symptomatically and discharged with medications for pain and muscle spasm.  She no concerning findings for other etiologies if her pain.        Cyndra Numbers, MD 12/04/11 [phone]

## 2011-12-02 NOTE — ED Notes (Signed)
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

## 2012-01-18 ENCOUNTER — Emergency Department (INDEPENDENT_AMBULATORY_CARE_PROVIDER_SITE_OTHER): Payer: Medicaid Other

## 2012-01-18 ENCOUNTER — Encounter (HOSPITAL_BASED_OUTPATIENT_CLINIC_OR_DEPARTMENT_OTHER): Payer: Self-pay

## 2012-01-18 ENCOUNTER — Emergency Department (HOSPITAL_BASED_OUTPATIENT_CLINIC_OR_DEPARTMENT_OTHER)
Admission: EM | Admit: 2012-01-18 | Discharge: 2012-01-18 | Disposition: A | Discharge status: Home / Self Care | Payer: Medicaid Other | Attending: Emergency Medicine | Admitting: Emergency Medicine

## 2012-01-18 DIAGNOSIS — R112 Nausea with vomiting, unspecified: Secondary | ICD-10-CM

## 2012-01-18 DIAGNOSIS — F17200 Nicotine dependence, unspecified, uncomplicated: Secondary | ICD-10-CM | POA: Insufficient documentation

## 2012-01-18 DIAGNOSIS — K389 Disease of appendix, unspecified: Secondary | ICD-10-CM

## 2012-01-18 DIAGNOSIS — R1031 Right lower quadrant pain: Secondary | ICD-10-CM | POA: Insufficient documentation

## 2012-01-18 DIAGNOSIS — A499 Bacterial infection, unspecified: Secondary | ICD-10-CM | POA: Insufficient documentation

## 2012-01-18 DIAGNOSIS — B9689 Other specified bacterial agents as the cause of diseases classified elsewhere: Secondary | ICD-10-CM

## 2012-01-18 DIAGNOSIS — H9190 Unspecified hearing loss, unspecified ear: Secondary | ICD-10-CM | POA: Insufficient documentation

## 2012-01-18 DIAGNOSIS — J45909 Unspecified asthma, uncomplicated: Secondary | ICD-10-CM | POA: Insufficient documentation

## 2012-01-18 DIAGNOSIS — R509 Fever, unspecified: Secondary | ICD-10-CM

## 2012-01-18 DIAGNOSIS — N760 Acute vaginitis: Secondary | ICD-10-CM | POA: Insufficient documentation

## 2012-01-18 LAB — CBC
MCH: 30.8 pg (ref 26.0–34.0)
Platelets: 257 10*3/uL (ref 150–400)
RBC: 4.65 MIL/uL (ref 3.87–5.11)
WBC: 13.1 10*3/uL — ABNORMAL HIGH (ref 4.0–10.5)

## 2012-01-18 LAB — WET PREP, GENITAL: Yeast Wet Prep HPF POC: NONE SEEN

## 2012-01-18 LAB — URINALYSIS, ROUTINE W REFLEX MICROSCOPIC
Leukocytes, UA: NEGATIVE
Nitrite: NEGATIVE
Specific Gravity, Urine: 1.025 (ref 1.005–1.030)
Urobilinogen, UA: 0.2 mg/dL (ref 0.0–1.0)

## 2012-01-18 LAB — DIFFERENTIAL
Eosinophils Absolute: 0.3 10*3/uL (ref 0.0–0.7)
Lymphocytes Relative: 30 % (ref 12–46)
Lymphs Abs: 3.9 10*3/uL (ref 0.7–4.0)
Neutro Abs: 7.9 10*3/uL — ABNORMAL HIGH (ref 1.7–7.7)
Neutrophils Relative %: 61 % (ref 43–77)

## 2012-01-18 LAB — BASIC METABOLIC PANEL
Chloride: 101 mEq/L (ref 96–112)
GFR calc non Af Amer: >90 mL/min (ref >90)
Glucose, Bld: 82 mg/dL (ref 70–99)
Potassium: 3.4 mEq/L — ABNORMAL LOW (ref 3.5–5.1)
Sodium: 139 mEq/L (ref 135–145)

## 2012-01-18 LAB — PREGNANCY, URINE: Preg Test, Ur: NEGATIVE

## 2012-01-18 MED ORDER — ONDANSETRON HCL 4 MG/2ML IJ SOLN
4.0000 mg | Freq: Once | INTRAMUSCULAR | Status: AC
  Administered 2012-01-18: 4 mg via INTRAVENOUS
  Filled 2012-01-18: qty 2

## 2012-01-18 MED ORDER — METRONIDAZOLE 500 MG PO TABS: 500.0000 mg | ORAL_TABLET | Freq: Two times a day (BID) | ORAL | Status: AC

## 2012-01-18 MED ORDER — IOHEXOL 300 MG/ML  SOLN
100.0000 mL | Freq: Once | INTRAMUSCULAR | Status: AC | PRN
  Administered 2012-01-18: 100 mL via INTRAVENOUS

## 2012-01-18 MED ORDER — IOHEXOL 300 MG/ML  SOLN
20.0000 mL | INTRAMUSCULAR | Status: AC
  Administered 2012-01-18 (×2): 20 mL via ORAL

## 2012-01-18 MED ORDER — MORPHINE SULFATE 4 MG/ML IJ SOLN
4.0000 mg | Freq: Once | INTRAMUSCULAR | Status: AC
Start: 2012-01-18 — End: 2012-01-18
  Administered 2012-01-18: 4 mg via INTRAVENOUS
  Filled 2012-01-18: qty 1

## 2012-01-18 MED ORDER — HYDROCODONE-ACETAMINOPHEN 5-500 MG PO TABS: 1.0000 | ORAL_TABLET | Freq: Four times a day (QID) | ORAL | Status: AC | PRN

## 2012-01-18 NOTE — ED Notes (Signed)
Pt reports RLQ pain that stared 2 hours PTA.  Seen by PMD PTA and sent to ED for further evaluation.  Pelvis ultrasound Thursday.

## 2012-01-18 NOTE — Discharge Instructions (Signed)
Abdominal Pain Abdominal pain can be caused by many things. Your caregiver decides the seriousness of your pain by an examination and possibly blood tests and X-rays. Many cases can be observed and treated at home. Most abdominal pain is not caused by a disease and will probably improve without treatment. However, in many cases, more time must pass before a clear cause of the pain can be found. Before that point, it may not be known if you need more testing, or if hospitalization or surgery is needed. HOME CARE INSTRUCTIONS   Do not take laxatives unless directed by your caregiver.   Take pain medicine only as directed by your caregiver.   Only take over-the-counter or prescription medicines for pain, discomfort, or fever as directed by your caregiver.   Try a clear liquid diet (broth, tea, or water) for as long as directed by your caregiver. Slowly move to a bland diet as tolerated.  SEEK IMMEDIATE MEDICAL CARE IF:   The pain does not go away.   You have a fever.   You keep throwing up (vomiting).   The pain is felt only in portions of the abdomen. Pain in the right side could possibly be appendicitis. In an adult, pain in the left lower portion of the abdomen could be colitis or diverticulitis.   You pass bloody or black tarry stools.  MAKE SURE YOU:   Understand these instructions.   Will watch your condition.   Will get help right away if you are not doing well or get worse.  Document Released: 08/25/2005 Document Revised: 07/28/2011 Document Reviewed: 07/03/2008 [facility] Patient Information 2012 [place], [place].Bacterial Vaginosis Bacterial vaginosis (BV) is a vaginal infection where the normal balance of bacteria in the vagina is disrupted. The normal balance is then replaced by an overgrowth of certain bacteria. There are several different kinds of bacteria that can cause BV. BV is the most common vaginal infection in women of childbearing age. CAUSES   The cause of BV is not  fully understood. BV develops when there is an increase or imbalance of harmful bacteria.   Some activities or behaviors can upset the normal balance of bacteria in the vagina and put women at increased risk including:   Having a new sex partner or multiple sex partners.   Douching.   Using an intrauterine device (IUD) for contraception.   It is not clear what role sexual activity plays in the development of BV. However, women that have never had sexual intercourse are rarely infected with BV.  Women do not get BV from toilet seats, bedding, swimming pools or from touching objects around them.  SYMPTOMS   Grey vaginal discharge.   A fish-like odor with discharge, especially after sexual intercourse.   Itching or burning of the vagina and vulva.   Burning or pain with urination.   Some women have no signs or symptoms at all.  DIAGNOSIS  Your caregiver must examine the vagina for signs of BV. Your caregiver will perform lab tests and look at the sample of vaginal fluid through a microscope. They will look for bacteria and abnormal cells (clue cells), a pH test higher than 4.5, and a positive amine test all associated with BV.  RISKS AND COMPLICATIONS   Pelvic inflammatory disease (PID).   Infections following gynecology surgery.   Developing HIV.   Developing herpes virus.  TREATMENT  Sometimes BV will clear up without treatment. However, all women with symptoms of BV should be treated to avoid complications,  especially if gynecology surgery is planned. Female partners generally do not need to be treated. However, BV may spread between female sex partners so treatment is helpful in preventing a recurrence of BV.   BV may be treated with antibiotics. The antibiotics come in either pill or vaginal cream forms. Either can be used with nonpregnant or pregnant women, but the recommended dosages differ. These antibiotics are not harmful to the baby.   BV can recur after treatment. If  this happens, a second round of antibiotics will often be prescribed.   Treatment is important for pregnant women. If not treated, BV can cause a premature delivery, especially for a pregnant woman who had a premature birth in the past. All pregnant women who have symptoms of BV should be checked and treated.   For chronic reoccurrence of BV, treatment with a type of prescribed gel vaginally twice a week is helpful.  HOME CARE INSTRUCTIONS   Finish all medication as directed by your caregiver.   Do not have sex until treatment is completed.   Tell your sexual partner that you have a vaginal infection. They should see their caregiver and be treated if they have problems, such as a mild rash or itching.   Practice safe sex. Use condoms. Only have 1 sex partner.  PREVENTION  Basic prevention steps can help reduce the risk of upsetting the natural balance of bacteria in the vagina and developing BV:  Do not have sexual intercourse (be abstinent).   Do not douche.   Use all of the medicine prescribed for treatment of BV, even if the signs and symptoms go away.   Tell your sex partner if you have BV. That way, they can be treated, if needed, to prevent reoccurrence.  SEEK MEDICAL CARE IF:   Your symptoms are not improving after 3 days of treatment.   You have increased discharge, pain, or fever.  MAKE SURE YOU:   Understand these instructions.   Will watch your condition.   Will get help right away if you are not doing well or get worse.  FOR MORE INFORMATION  Division of STD Prevention (DSTDP), Centers for Disease Control and Prevention: [employer] American Social Health Association (ASHA): www.ashastd.org  Document Released: 11/15/2005 Document Revised: 07/28/2011 Document Reviewed: 05/08/2009 [facility] Patient Information 2012 [place], [place].

## 2012-01-18 NOTE — ED Provider Notes (Signed)
History     CSN: [id number]  Arrival date & time 01/18/12  1756   First MD Initiated Contact with Patient 01/18/12 1808      Chief Complaint  Patient presents with  . Abdominal Pain    (Consider location/radiation/quality/duration/timing/severity/associated sxs/prior treatment) HPI Comments: Pt states that she has a history of kidney stone and cyst although she is not sure if this is similar:pt states that she had an ultrasound 5 days ago because her pcp thought she felt fullness on exam:pt hasn't gotten results  Patient is a 31 y.o. female presenting with abdominal pain. The history is provided by the patient. No language interpreter was used.  Abdominal Pain The primary symptoms of the illness include abdominal pain, nausea and vaginal discharge. The primary symptoms of the illness do not include vomiting, diarrhea or dysuria. The current episode started 1 to 2 hours ago. The onset of the illness was sudden. The problem has not changed since onset. The vaginal discharge is not associated with dysuria.     Past Medical History  Diagnosis Date  . Asthma   . Chronic lower back pain   . Chronic ear infection   . Kidney stones   . Hearing loss in left ear     Past Surgical History  Procedure Date  . Dilation and curettage of uterus   . Tubal ligation     No family history on file.  History  Substance Use Topics  . Smoking status: Current Everyday Smoker -- 1.0 packs/day for 15 years    Types: Cigarettes  . Smokeless tobacco: Not on file  . Alcohol Use: Yes     occassionally    OB History    Grav Para Term Preterm Abortions TAB SAB Ect Mult Living                  Review of Systems  Gastrointestinal: Positive for nausea and abdominal pain. Negative for vomiting and diarrhea.  Genitourinary: Positive for vaginal discharge. Negative for dysuria.  All other systems reviewed and are negative.    Allergies  Amoxicillin and Penicillins  Home Medications    Current Outpatient Rx  Name Route Sig Dispense Refill  . GUAIFENESIN ER 600 MG PO TB12 Oral Take 1,200 mg by mouth 2 (two) times daily.      . IBUPROFEN 600 MG PO TABS Oral Take 600 mg by mouth every 6 (six) hours as needed. Pain back    . LORATADINE 10 MG PO TABS Oral Take 10 mg by mouth daily. allergies      BP 123/96  Pulse 83  Temp(Src) 98.2 F (36.8 C) (Oral)  Resp 18  Ht 5\' 6"  (1.676 m)  Wt 145 lb (65.772 kg)  BMI 23.40 kg/m2  SpO2 100%  LMP 01/04/2012  Physical Exam  Nursing note and vitals reviewed. Constitutional: She is oriented to person, place, and time. She appears well-developed and well-nourished.  HENT:  Head: Normocephalic and atraumatic.  Eyes: Conjunctivae and EOM are normal.  Neck: Neck supple.  Cardiovascular: Normal rate and regular rhythm.   Pulmonary/Chest: Effort normal and breath sounds normal.  Abdominal: Soft. Bowel sounds are normal. There is tenderness in the right lower quadrant.  Genitourinary:       White vaginal discharge:neg cmt  Musculoskeletal: Normal range of motion.  Neurological: She is alert and oriented to person, place, and time.  Skin: Skin is warm and dry.  Psychiatric: She has a normal mood and affect.    ED  Course  Procedures (including critical care time)  Labs Reviewed  URINALYSIS, ROUTINE W REFLEX MICROSCOPIC - Abnormal; Notable for the following:    APPearance CLOUDY (*)    All other components within normal limits  CBC - Abnormal; Notable for the following:    WBC 13.1 (*)    All other components within normal limits  DIFFERENTIAL - Abnormal; Notable for the following:    Neutro Abs 7.9 (*)    All other components within normal limits  BASIC METABOLIC PANEL - Abnormal; Notable for the following:    Potassium 3.4 (*)    All other components within normal limits  PREGNANCY, URINE  GC/CHLAMYDIA PROBE AMP, GENITAL  WET PREP, GENITAL   Ct Abdomen Pelvis W Contrast  01/18/2012  *RADIOLOGY REPORT*  Clinical  Data: 31 year old female with right lower quadrant pain, nausea, vomiting, fever, prior tubal ligation.  CT ABDOMEN AND PELVIS WITH CONTRAST  Technique:  Multidetector CT imaging of the abdomen and pelvis was performed following the standard protocol during bolus administration of intravenous contrast.  Contrast: OMNIPAQUE IOHEXOL 300 MG/ML IV SOLN  Comparison: 09/12/2010.  Findings: Mild dependent atelectasis. No acute osseous abnormality identified.  Prominence of the lower uterine segment is stable. Possible Nabothian cyst.  No pelvic free fluid.  Adnexa within normal limits for age.  Small pelvic phleboliths.  Unremarkable bladder.  Mild diverticulosis of the sigmoid colon, otherwise negative distal colon.  The appendix is now located anterior to the cecum.  Chronic appendicolith at the base of the appendix is suspected and may be increased.  Beyond this level, the appendix size is at the upper limits of normal to slightly increased (7 mm), but no fat stranding is seen adjacent to the appendix.  The cecum otherwise is within normal limits.  Negative terminal ileum, not yet opacified with contrast.  No dilated small bowel.  Stomach distended with contrast, otherwise negative.  Negative liver, gallbladder (phrygian cap), spleen, pancreas, adrenal glands, portal venous system, major arterial structures, and kidneys.  No abdominal free fluid or lymphadenopathy.  IMPRESSION: 1.  Appendicolith but no convincing evidence of acute appendicitis. 2.  No acute findings in the abdomen or pelvis.  Original Report Authenticated By: Ulla Potash III, M.D.     1. BV (bacterial vaginosis)   2. Abdominal pain       MDM  No acute findings on ct:pt is comfortable at this time:exam not consistent with pid:pt okay to follow up with pcp        Teressa Lower, NP 01/18/12 2114

## 2012-01-19 LAB — GC/CHLAMYDIA PROBE AMP, GENITAL: Chlamydia, DNA Probe: NEGATIVE

## 2012-01-19 NOTE — ED Provider Notes (Signed)
Medical screening examination/treatment/procedure(s) were performed by non-physician practitioner and as supervising physician I was immediately available for consultation/collaboration.   Ling Flesch, MD 01/19/12 1215 

## 2012-03-09 ENCOUNTER — Encounter (HOSPITAL_BASED_OUTPATIENT_CLINIC_OR_DEPARTMENT_OTHER): Payer: Self-pay | Admitting: *Deleted

## 2012-03-09 ENCOUNTER — Emergency Department (HOSPITAL_BASED_OUTPATIENT_CLINIC_OR_DEPARTMENT_OTHER)
Admission: EM | Admit: 2012-03-09 | Discharge: 2012-03-09 | Disposition: A | Discharge status: Home / Self Care | Payer: Medicaid Other | Attending: Emergency Medicine | Admitting: Emergency Medicine

## 2012-03-09 DIAGNOSIS — A499 Bacterial infection, unspecified: Secondary | ICD-10-CM | POA: Insufficient documentation

## 2012-03-09 DIAGNOSIS — J45909 Unspecified asthma, uncomplicated: Secondary | ICD-10-CM | POA: Insufficient documentation

## 2012-03-09 DIAGNOSIS — N949 Unspecified condition associated with female genital organs and menstrual cycle: Secondary | ICD-10-CM | POA: Insufficient documentation

## 2012-03-09 DIAGNOSIS — N760 Acute vaginitis: Secondary | ICD-10-CM | POA: Insufficient documentation

## 2012-03-09 DIAGNOSIS — Z79899 Other long term (current) drug therapy: Secondary | ICD-10-CM | POA: Insufficient documentation

## 2012-03-09 DIAGNOSIS — G8929 Other chronic pain: Secondary | ICD-10-CM | POA: Insufficient documentation

## 2012-03-09 DIAGNOSIS — M545 Low back pain, unspecified: Secondary | ICD-10-CM | POA: Insufficient documentation

## 2012-03-09 DIAGNOSIS — B9689 Other specified bacterial agents as the cause of diseases classified elsewhere: Secondary | ICD-10-CM | POA: Insufficient documentation

## 2012-03-09 HISTORY — DX: Opioid dependence, uncomplicated: F11.20

## 2012-03-09 HISTORY — DX: Cannabis dependence, uncomplicated: F12.20

## 2012-03-09 LAB — URINALYSIS, ROUTINE W REFLEX MICROSCOPIC
Bilirubin Urine: NEGATIVE
Glucose, UA: NEGATIVE mg/dL
Hgb urine dipstick: NEGATIVE
Specific Gravity, Urine: 1.017 (ref 1.005–1.030)
pH: 7 (ref 5.0–8.0)

## 2012-03-09 LAB — WET PREP, GENITAL
Trich, Wet Prep: NONE SEEN
Yeast Wet Prep HPF POC: NONE SEEN

## 2012-03-09 MED ORDER — METRONIDAZOLE 500 MG PO TABS: 500.0000 mg | ORAL_TABLET | Freq: Two times a day (BID) | ORAL | Status: AC

## 2012-03-09 MED ORDER — OXYCODONE-ACETAMINOPHEN 5-325 MG PO TABS: 1.0000 | ORAL_TABLET | Freq: Four times a day (QID) | ORAL | Status: AC | PRN

## 2012-03-09 MED ORDER — OXYCODONE-ACETAMINOPHEN 5-325 MG PO TABS
2.0000 | ORAL_TABLET | Freq: Once | ORAL | Status: AC
  Administered 2012-03-09: 2 via ORAL
  Filled 2012-03-09: qty 2

## 2012-03-09 MED ORDER — FLUCONAZOLE 50 MG PO TABS
150.0000 mg | ORAL_TABLET | Freq: Once | ORAL | Status: AC
  Administered 2012-03-09: 150 mg via ORAL
  Filled 2012-03-09: qty 1

## 2012-03-09 MED ORDER — METRONIDAZOLE 500 MG PO TABS
500.0000 mg | ORAL_TABLET | Freq: Once | ORAL | Status: AC
  Administered 2012-03-09: 500 mg via ORAL
  Filled 2012-03-09: qty 1

## 2012-03-09 NOTE — ED Notes (Signed)
Vaginal swelling and odor since this am.

## 2012-03-09 NOTE — Discharge Instructions (Signed)
Bacterial Vaginosis Bacterial vaginosis (BV) is a vaginal infection where the normal balance of bacteria in the vagina is disrupted. The normal balance is then replaced by an overgrowth of certain bacteria. There are several different kinds of bacteria that can cause BV. BV is the most common vaginal infection in women of childbearing age. CAUSES   The cause of BV is not fully understood. BV develops when there is an increase or imbalance of harmful bacteria.   Some activities or behaviors can upset the normal balance of bacteria in the vagina and put women at increased risk including:   Having a new sex partner or multiple sex partners.   Douching.   Using an intrauterine device (IUD) for contraception.   It is not clear what role sexual activity plays in the development of BV. However, women that have never had sexual intercourse are rarely infected with BV.  Women do not get BV from toilet seats, bedding, swimming pools or from touching objects around them.  SYMPTOMS   Grey vaginal discharge.   A fish-like odor with discharge, especially after sexual intercourse.   Itching or burning of the vagina and vulva.   Burning or pain with urination.   Some women have no signs or symptoms at all.  DIAGNOSIS  Your caregiver must examine the vagina for signs of BV. Your caregiver will perform lab tests and look at the sample of vaginal fluid through a microscope. They will look for bacteria and abnormal cells (clue cells), a pH test higher than 4.5, and a positive amine test all associated with BV.  RISKS AND COMPLICATIONS   Pelvic inflammatory disease (PID).   Infections following gynecology surgery.   Developing HIV.   Developing herpes virus.  TREATMENT  Sometimes BV will clear up without treatment. However, all women with symptoms of BV should be treated to avoid complications, especially if gynecology surgery is planned. Female partners generally do not need to be treated. However,  BV may spread between female sex partners so treatment is helpful in preventing a recurrence of BV.   BV may be treated with antibiotics. The antibiotics come in either pill or vaginal cream forms. Either can be used with nonpregnant or pregnant women, but the recommended dosages differ. These antibiotics are not harmful to the baby.   BV can recur after treatment. If this happens, a second round of antibiotics will often be prescribed.   Treatment is important for pregnant women. If not treated, BV can cause a premature delivery, especially for a pregnant woman who had a premature birth in the past. All pregnant women who have symptoms of BV should be checked and treated.   For chronic reoccurrence of BV, treatment with a type of prescribed gel vaginally twice a week is helpful.  HOME CARE INSTRUCTIONS   Finish all medication as directed by your caregiver.   Do not have sex until treatment is completed.   Tell your sexual partner that you have a vaginal infection. They should see their caregiver and be treated if they have problems, such as a mild rash or itching.   Practice safe sex. Use condoms. Only have 1 sex partner.  PREVENTION  Basic prevention steps can help reduce the risk of upsetting the natural balance of bacteria in the vagina and developing BV:  Do not have sexual intercourse (be abstinent).   Do not douche.   Use all of the medicine prescribed for treatment of BV, even if the signs and symptoms go away.     Tell your sex partner if you have BV. That way, they can be treated, if needed, to prevent reoccurrence.  SEEK MEDICAL CARE IF:   Your symptoms are not improving after 3 days of treatment.   You have increased discharge, pain, or fever.  MAKE SURE YOU:   Understand these instructions.   Will watch your condition.   Will get help right away if you are not doing well or get worse.  FOR MORE INFORMATION  Division of STD Prevention (DSTDP), Centers for Disease  Control and Prevention: www.cdc.gov/std American Social Health Association (ASHA): www.ashastd.org  Document Released: 11/15/2005 Document Revised: 11/04/2011 Document Reviewed: 05/08/2009 [employer] Patient Information 2012 [employer].Vaginitis Vaginitis is an infection. It causes soreness, swelling, and redness (inflammation) of the vagina. Many of these infections are sexually transmitted diseases (STDs). Having unprotected sex can cause further problems and complications such as:  Chronic pelvic pain.   Infertility.   Unwanted pregnancy.   Abortion.   Tubal pregnancy.   Infection passed on to the newborn.   Cancer.  CAUSES   Monilia. This is a yeast or fungus infection, not an STD.   Bacterial vaginosis. The normal balance of bacteria in the vagina is disrupted and is replaced by an overgrowth of certain bacteria.   Gonorrhea, chlamydia. These are bacterial infections that are STDs.   Vaginal sponges, diaphragms, and intrauterine devices.   Trichomoniasis. This is a STD infection caused by a parasite.   Viruses like herpes and human papillomavirus. Both are STDs.   Pregnancy.   Immunosuppression. This occurs with certain conditions such as HIV infection or cancer.   Using bubble bath.   Taking certain antibiotic medicines.   Sporadic recurrence can occur if you become sick.   Diabetes.   Steroids.   Allergic reaction. If you have an allergy to:   Douches.   Soaps.   Spermicides.   Condoms.   Scented tampons or vaginal sprays.  SYMPTOMS   Abnormal vaginal discharge.   Itching of the vagina.   Pain in the vagina.   Swelling of the vagina.  In some cases, there are no symptoms. TREATMENT  Treatment will vary depending on the type of infection.  Bacteria or trichomonas are usually treated with oral antibiotics and sometimes vaginal cream or suppositories.   Monilia vaginitis is usually treated with vaginal creams, suppositories, or oral  antifungal pills.   Viral vaginitis has no cure. However, the symptoms of herpes (a viral vaginitis) can be treated to relieve the discomfort. Human papillomavirus has no symptoms. However, there are treatments for the diseases caused by human papillomavirus.   With allergic vaginitis, you need to stop using the product that is causing the problem. Vaginal creams can be used to treat the symptoms.   When treating an STD, the sex partner should also be treated.  HOME CARE INSTRUCTIONS   Take all the medicines as directed by your caregiver.   Do not use scented tampons, soaps, or vaginal sprays.   Do not douche.   Tell your sex partner if you have a vaginal infection or an STD.   Do not have sexual intercourse until you have treated the vaginitis.   Practice safe sex by using condoms.  SEEK MEDICAL CARE IF:   You have abdominal pain.   Your symptoms get worse during treatment.  Document Released: 09/12/2007 Document Revised: 11/04/2011 Document Reviewed: 05/08/2009 [employer] Patient Information 2012 [employer]. 

## 2012-03-09 NOTE — ED Provider Notes (Signed)
History     CSN: [id number]  Arrival date & time 03/09/12  1636   First MD Initiated Contact with Patient 03/09/12 1712      Chief Complaint  Patient presents with  . Vaginitis    (Consider location/radiation/quality/duration/timing/severity/associated sxs/prior treatment) HPI Patient is a 31 yo F who presents complaining of 1 day of vulvar and vaginal swelling with foul-smelling discharge.  She does not suspect she could have a STI.  She denies urinary symptoms, abdominal pain, nausea, vomiting, and fever.  She has no history of STI and PID.  Patient does not suspect she is pregnant.  There is nothing that makes her pain better or worse.  There are no other associated or modifying factors.  Past Medical History  Diagnosis Date  . Asthma   . Chronic lower back pain   . Chronic ear infection   . Kidney stones   . Hearing loss in left ear   . Heroin addiction   . Marijuana dependence     Past Surgical History  Procedure Date  . Dilation and curettage of uterus   . Tubal ligation     No family history on file.  History  Substance Use Topics  . Smoking status: Current Everyday Smoker -- 1.0 packs/day for 15 years    Types: Cigarettes  . Smokeless tobacco: Not on file  . Alcohol Use: Yes     occassionally    OB History    Grav Para Term Preterm Abortions TAB SAB Ect Mult Living                  Review of Systems  Constitutional: Negative.   HENT: Negative.   Eyes: Negative.   Respiratory: Negative.   Cardiovascular: Negative.   Gastrointestinal: Negative.   Genitourinary: Positive for vaginal discharge and vaginal pain.  Musculoskeletal: Negative.   Skin: Negative.   Neurological: Negative.   Hematological: Negative.   Psychiatric/Behavioral: Negative.   All other systems reviewed and are negative.    Allergies  Amoxicillin and Penicillins  Home Medications   Current Outpatient Rx  Name Route Sig Dispense Refill  . CLONIDINE HCL 0.1 MG PO TABS  Oral Take 0.1 mg by mouth 2 (two) times daily.    Marland Kitchen BENADRYL ALLERGY PO Oral Take 2 tablets by mouth daily as needed. Patient used this medication for her allergies.    Marland Kitchen GABAPENTIN 300 MG PO CAPS Oral Take 300 mg by mouth 3 (three) times daily.    Marland Kitchen HYDROXYZINE HCL 50 MG PO TABS Oral Take 50 mg by mouth 3 (three) times daily as needed.    . IBUPROFEN 600 MG PO TABS Oral Take 600 mg by mouth every 6 (six) hours as needed. Pain back    . METHOCARBAMOL 750 MG PO TABS Oral Take 750 mg by mouth 4 (four) times daily.    . SERTRALINE HCL 100 MG PO TABS Oral Take 100 mg by mouth daily.    Marland Kitchen LORATADINE 10 MG PO TABS Oral Take 10 mg by mouth daily. allergies    . METRONIDAZOLE 500 MG PO TABS Oral Take 1 tablet (500 mg total) by mouth 2 (two) times daily. 14 tablet 0  . OXYCODONE-ACETAMINOPHEN 5-325 MG PO TABS Oral Take 1 tablet by mouth every 6 (six) hours as needed for pain. 5 tablet 0    BP 114/68  Pulse 81  Temp(Src) 98.3 F (36.8 C) (Oral)  Resp 20  Wt 145 lb (65.772 kg)  SpO2 98%  Physical Exam  Nursing note and vitals reviewed. GEN: Well-developed, well-nourished female in no distress HEENT: Atraumatic, normocephalic. Oropharynx clear without erythema EYES: PERRLA BL, no scleral icterus. NECK: Trachea midline, no meningismus CV: regular rate and rhythm. No murmurs, rubs, or gallops PULM: No respiratory distress.  No crackles, wheezes, or rales. GI: soft, non-tender. No guarding, rebound, or tenderness. + bowel sounds  GU: external exam shows vulvar swelling but no areas suggestive of abscess.  There is moderate amount of foul-smelling vaginal discharge. cervix is tender and lower-lying than expected.  There is no adnexal tenderness Neuro: cranial nerves 2-12 intact, no abnormalities of strength or sensation, A and O x 3 MSK: Patient moves all 4 extremities symmetrically, no deformity, edema, or injury noted Skin: No rashes petechiae, purpura, or jaundice Psych: no abnormality of  mood   ED Course  Procedures (including critical care time)  Labs Reviewed  URINALYSIS, ROUTINE W REFLEX MICROSCOPIC - Abnormal; Notable for the following:    APPearance CLOUDY (*)    All other components within normal limits  WET PREP, GENITAL - Abnormal; Notable for the following:    Clue Cells Wet Prep HPF POC MODERATE (*)    WBC, Wet Prep HPF POC RARE (*)    All other components within normal limits  PREGNANCY, URINE  GC/CHLAMYDIA PROBE AMP, GENITAL   No results found.   1. Bacterial vaginosis   2. Vulvovaginitis       MDM  Patient was evaluated and pelvic was performed.  UA and Upreg were negative.  Wet prep was remarkable for BV.  Patient also had vulovaginal swelling concerning for a candidal component.  Patient was treated with diflucan and flagyl.  Patient and I discussed empiric therapy for cervicitis but she preferred to wait for return of cultures.  Patient was discharged with 5 pills of percocet and 7 days of flagyl.  Patient was told to follow-up with an ob-gyn regarding her cervical findings.  Patient was discharged in good condition.        Cyndra Numbers, MD 03/10/12 1356

## 2012-03-10 LAB — GC/CHLAMYDIA PROBE AMP, GENITAL: GC Probe Amp, Genital: NEGATIVE

## 2012-03-13 ENCOUNTER — Emergency Department (HOSPITAL_BASED_OUTPATIENT_CLINIC_OR_DEPARTMENT_OTHER)
Admission: EM | Admit: 2012-03-13 | Discharge: 2012-03-13 | Disposition: A | Discharge status: Home / Self Care | Payer: Medicaid Other | Attending: Emergency Medicine | Admitting: Emergency Medicine

## 2012-03-13 ENCOUNTER — Encounter (HOSPITAL_BASED_OUTPATIENT_CLINIC_OR_DEPARTMENT_OTHER): Payer: Self-pay | Admitting: *Deleted

## 2012-03-13 DIAGNOSIS — J45909 Unspecified asthma, uncomplicated: Secondary | ICD-10-CM | POA: Insufficient documentation

## 2012-03-13 DIAGNOSIS — A499 Bacterial infection, unspecified: Secondary | ICD-10-CM | POA: Insufficient documentation

## 2012-03-13 DIAGNOSIS — N9489 Other specified conditions associated with female genital organs and menstrual cycle: Secondary | ICD-10-CM | POA: Insufficient documentation

## 2012-03-13 DIAGNOSIS — N760 Acute vaginitis: Secondary | ICD-10-CM | POA: Insufficient documentation

## 2012-03-13 DIAGNOSIS — Z79899 Other long term (current) drug therapy: Secondary | ICD-10-CM | POA: Insufficient documentation

## 2012-03-13 DIAGNOSIS — B9689 Other specified bacterial agents as the cause of diseases classified elsewhere: Secondary | ICD-10-CM | POA: Insufficient documentation

## 2012-03-13 DIAGNOSIS — Z87442 Personal history of urinary calculi: Secondary | ICD-10-CM | POA: Insufficient documentation

## 2012-03-13 DIAGNOSIS — F17200 Nicotine dependence, unspecified, uncomplicated: Secondary | ICD-10-CM | POA: Insufficient documentation

## 2012-03-13 LAB — URINALYSIS, ROUTINE W REFLEX MICROSCOPIC
Glucose, UA: NEGATIVE mg/dL
Hgb urine dipstick: NEGATIVE
Specific Gravity, Urine: 1.016 (ref 1.005–1.030)
Urobilinogen, UA: 1 mg/dL (ref 0.0–1.0)

## 2012-03-13 MED ORDER — KETOROLAC TROMETHAMINE 60 MG/2ML IM SOLN: 60.0000 mg | Freq: Once | INTRAMUSCULAR | Status: DC

## 2012-03-13 NOTE — ED Provider Notes (Signed)
Medical screening examination/treatment/procedure(s) were performed by non-physician practitioner and as supervising physician I was immediately available for consultation/collaboration.    Celene Kras, MD 03/13/12 [phone]

## 2012-03-13 NOTE — ED Notes (Signed)
Was treated for BV last week. Completed her antibiotics. Here for vaginal swelling and chills.

## 2012-03-13 NOTE — ED Notes (Signed)
 after pelvic exam, I went back in and requested a urine sample.  She said ok and I left.  Shortly after another RN came  And reported her crying.  When I went in and ask what was wrong,her husband spoke up and said the PA was very unprofessional because her facial expression had changed after the discussion she and her wife had about narcotics.  I explained to both, but mainly the pt, that Ms.Layson had stated that someone today had told her we could give her some percocets and it wouldn't throw her back into her addiction.  Cassia Fein,pA had said that really percocets was one of the most addictive drugs.  She knew she was uncomfortable and she would give her a shot to help her but probable would not give percocets.  The pt agreed and seemed ok but the husband still seemed displeased. I went to tell Linus Mako and someone saw them walk out.  I went out to the parking lot to ask her to come back in and get her med,but couldn't find them

## 2012-03-13 NOTE — ED Provider Notes (Signed)
History     CSN: [id number]  Arrival date & time 03/13/12  1752   First MD Initiated Contact with Patient 03/13/12 1858      No chief complaint on file.   (Consider location/radiation/quality/duration/timing/severity/associated sxs/prior treatment) Patient is a 31 y.o. female presenting with vaginal discharge. The history is provided by the patient. No language interpreter was used.  Vaginal Discharge This is a new problem. The current episode started today. The problem occurs constantly. The problem has been gradually worsening. The symptoms are aggravated by nothing. She has tried nothing for the symptoms. The treatment provided moderate relief.  Pt complains of continued vaginal swelling.  Pt was seen here on 4/11 and had a wet prep that showed clue cells.  Pt has a ct scan in February of abdomen which was normal.  Ultrasound by primary MD in March.  Pt has not followed up.    Past Medical History  Diagnosis Date  . Asthma   . Chronic lower back pain   . Chronic ear infection   . Kidney stones   . Hearing loss in left ear   . Heroin addiction   . Marijuana dependence     Past Surgical History  Procedure Date  . Dilation and curettage of uterus   . Tubal ligation     No family history on file.  History  Substance Use Topics  . Smoking status: Current Everyday Smoker -- 1.0 packs/day for 15 years    Types: Cigarettes  . Smokeless tobacco: Not on file  . Alcohol Use: Yes     occassionally    OB History    Grav Para Term Preterm Abortions TAB SAB Ect Mult Living                  Review of Systems  Genitourinary: Positive for vaginal discharge.  All other systems reviewed and are negative.    Allergies  Amoxicillin and Penicillins  Home Medications   Current Outpatient Rx  Name Route Sig Dispense Refill  . BUPRENORPHINE HCL-NALOXONE HCL 2-0.5 MG SL SUBL Sublingual Place 1 tablet under the tongue daily.    Marland Kitchen CLONIDINE HCL 0.1 MG PO TABS Oral Take 0.1 mg  by mouth 2 (two) times daily.    Marland Kitchen GABAPENTIN 300 MG PO CAPS Oral Take 300 mg by mouth 3 (three) times daily.    Marland Kitchen HYDROXYZINE HCL 50 MG PO TABS Oral Take 50 mg by mouth 3 (three) times daily as needed.    . IBUPROFEN 600 MG PO TABS Oral Take 600 mg by mouth every 6 (six) hours as needed. Pain back    . LORATADINE 10 MG PO TABS Oral Take 10 mg by mouth daily. allergies    . METRONIDAZOLE 500 MG PO TABS Oral Take 1 tablet (500 mg total) by mouth 2 (two) times daily. 14 tablet 0  . SERTRALINE HCL 100 MG PO TABS Oral Take 100 mg by mouth daily.    Marland Kitchen BENADRYL ALLERGY PO Oral Take 2 tablets by mouth daily as needed. Patient used this medication for her allergies.    . OXYCODONE-ACETAMINOPHEN 5-325 MG PO TABS Oral Take 1 tablet by mouth every 6 (six) hours as needed for pain. 5 tablet 0    BP 114/68  Pulse 86  Temp(Src) 98.1 F (36.7 C) (Oral)  Resp 20  SpO2 100%  Physical Exam  Vitals reviewed. Constitutional: She appears well-developed and well-nourished.  HENT:  Head: Normocephalic and atraumatic.  Right Ear: External ear  normal.  Mouth/Throat: Oropharynx is clear and moist.  Eyes: Pupils are equal, round, and reactive to light.  Neck: Normal range of motion.  Cardiovascular: Normal rate and normal heart sounds.   Pulmonary/Chest: Effort normal.  Abdominal: Soft.  Genitourinary: Vaginal discharge found.  Musculoskeletal: Normal range of motion.       Scant amount thick white vaginal discharge  Neurological: She is alert.  Skin: Skin is warm.  Psychiatric: She has a normal mood and affect.    ED Course  Procedures (including critical care time)  Labs Reviewed  WET PREP, GENITAL - Abnormal; Notable for the following:    Clue Cells Wet Prep HPF POC MODERATE (*)    WBC, Wet Prep HPF POC RARE (*)    All other components within normal limits  URINALYSIS, ROUTINE W REFLEX MICROSCOPIC   No results found.   No diagnosis found.    MDM  Pt left ama.   Pt is on flagyl.  Pt  covered for clue cells on wet prep.       Lonia Skinner [place], [place] 03/13/12 2019  Lonia Skinner [place], [place] 03/13/12 2024

## 2012-04-04 ENCOUNTER — Emergency Department (HOSPITAL_BASED_OUTPATIENT_CLINIC_OR_DEPARTMENT_OTHER)
Admission: EM | Admit: 2012-04-04 | Discharge: 2012-04-04 | Discharge status: Against Medical Advice | Payer: Medicaid Other | Attending: Emergency Medicine | Admitting: Emergency Medicine

## 2012-04-04 ENCOUNTER — Encounter (HOSPITAL_BASED_OUTPATIENT_CLINIC_OR_DEPARTMENT_OTHER): Payer: Self-pay

## 2012-04-04 DIAGNOSIS — F329 Major depressive disorder, single episode, unspecified: Secondary | ICD-10-CM

## 2012-04-04 DIAGNOSIS — N83209 Unspecified ovarian cyst, unspecified side: Secondary | ICD-10-CM | POA: Insufficient documentation

## 2012-04-04 DIAGNOSIS — F341 Dysthymic disorder: Secondary | ICD-10-CM | POA: Insufficient documentation

## 2012-04-04 DIAGNOSIS — IMO0002 Reserved for concepts with insufficient information to code with codable children: Secondary | ICD-10-CM | POA: Insufficient documentation

## 2012-04-04 DIAGNOSIS — F419 Anxiety disorder, unspecified: Secondary | ICD-10-CM

## 2012-04-04 DIAGNOSIS — F1920 Other psychoactive substance dependence, uncomplicated: Secondary | ICD-10-CM | POA: Insufficient documentation

## 2012-04-04 LAB — URINALYSIS, ROUTINE W REFLEX MICROSCOPIC
Glucose, UA: NEGATIVE mg/dL
Nitrite: NEGATIVE
Protein, ur: NEGATIVE mg/dL
pH: 7.5 (ref 5.0–8.0)

## 2012-04-04 LAB — URINE MICROSCOPIC-ADD ON

## 2012-04-04 LAB — RAPID URINE DRUG SCREEN, HOSP PERFORMED
Cocaine: POSITIVE — AB
Opiates: NOT DETECTED

## 2012-04-04 LAB — PREGNANCY, URINE: Preg Test, Ur: NEGATIVE

## 2012-04-04 NOTE — ED Notes (Signed)
Pt expressed to PA Sophia and this nurse that she would like help and agreed to speak to a counselor after offered by PA Sophia. Pt denies SI/HI and sts she does not feel safe at home and "needs pain medication". [facility]rapeutic alternatives called and Patsy Lager will have counselor call with ETA.

## 2012-04-04 NOTE — ED Notes (Signed)
Pt not found in room or lobby 

## 2012-04-04 NOTE — ED Notes (Signed)
Abdominal pain x 1 month.  States she was seen by OBGYN today. States OBGYN will not write an RX for narcotics Released from [facility] (substance abuse class).
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
## 2012-04-05 NOTE — ED Provider Notes (Signed)
Medical screening examination/treatment/procedure(s) were performed by non-physician practitioner and as supervising physician I was immediately available for consultation/collaboration.   Yara Tomkinson, MD 04/05/12 1751 

## 2012-07-22 ENCOUNTER — Encounter (HOSPITAL_BASED_OUTPATIENT_CLINIC_OR_DEPARTMENT_OTHER): Payer: Self-pay | Admitting: *Deleted

## 2012-07-22 DIAGNOSIS — G8929 Other chronic pain: Secondary | ICD-10-CM | POA: Insufficient documentation

## 2012-07-22 DIAGNOSIS — Y9289 Other specified places as the place of occurrence of the external cause: Secondary | ICD-10-CM | POA: Insufficient documentation

## 2012-07-22 DIAGNOSIS — F17200 Nicotine dependence, unspecified, uncomplicated: Secondary | ICD-10-CM | POA: Insufficient documentation

## 2012-07-22 DIAGNOSIS — S99919A Unspecified injury of unspecified ankle, initial encounter: Secondary | ICD-10-CM | POA: Insufficient documentation

## 2012-07-22 DIAGNOSIS — S8990XA Unspecified injury of unspecified lower leg, initial encounter: Secondary | ICD-10-CM | POA: Insufficient documentation

## 2012-07-22 DIAGNOSIS — J45909 Unspecified asthma, uncomplicated: Secondary | ICD-10-CM | POA: Insufficient documentation

## 2012-07-22 DIAGNOSIS — X58XXXA Exposure to other specified factors, initial encounter: Secondary | ICD-10-CM | POA: Insufficient documentation

## 2012-07-22 DIAGNOSIS — Y9389 Activity, other specified: Secondary | ICD-10-CM | POA: Insufficient documentation

## 2012-07-22 DIAGNOSIS — Y998 Other external cause status: Secondary | ICD-10-CM | POA: Insufficient documentation

## 2012-07-22 NOTE — ED Notes (Signed)
Pt states she has had trouble with her legs for about a month. Hx hysterectomy 2 months ago. Earlier today right leg "went numb" and she fell injuring her left ankle. C/O pain to same.

## 2012-07-23 ENCOUNTER — Emergency Department (HOSPITAL_BASED_OUTPATIENT_CLINIC_OR_DEPARTMENT_OTHER)
Admission: EM | Admit: 2012-07-23 | Discharge: 2012-07-23 | Disposition: A | Discharge status: Home / Self Care | Payer: Medicaid Other | Attending: Emergency Medicine | Admitting: Emergency Medicine

## 2012-07-23 ENCOUNTER — Emergency Department (HOSPITAL_BASED_OUTPATIENT_CLINIC_OR_DEPARTMENT_OTHER): Admit: 2012-07-23 | Discharge: 2012-07-23 | Disposition: A | Discharge status: Home / Self Care | Payer: Medicaid Other

## 2012-07-23 DIAGNOSIS — S93402A Sprain of unspecified ligament of left ankle, initial encounter: Secondary | ICD-10-CM

## 2012-07-23 MED ORDER — NAPROXEN 250 MG PO TABS
500.0000 mg | ORAL_TABLET | Freq: Once | ORAL | Status: AC
  Administered 2012-07-23: 500 mg via ORAL
  Filled 2012-07-23: qty 2

## 2012-07-23 MED ORDER — NAPROXEN 500 MG PO TABS: ORAL_TABLET | ORAL | Status: DC

## 2012-07-23 NOTE — ED Notes (Signed)
Patient was asleep on arrival to room.  Awakened easily.  Patient is unable to hold her eyes open when talking to nurse, eyes are red in color and patient is slurring her speech.  States she took her night time medications prior to arrival.

## 2012-07-23 NOTE — ED Provider Notes (Addendum)
History     CSN: [id number]  Arrival date & time 07/22/12  2306   None     Chief Complaint  Patient presents with  . Ankle Injury    (Consider location/radiation/quality/duration/timing/severity/associated sxs/prior treatment) HPI Is a 31 year old white female with about a two-month history of intermittent numbness of the left leg. She's had this evaluated by her physician. She felt her leg go numb yesterday evening at a football game. While trying to lift her son her left leg went out from under her and she injured her left ankle. She is having pain and tenderness in the anterior aspect of the left ankle. There is some associated swelling. Pain is moderate. Pain is worse with palpation, movement or ambulation. She denies other injury. She also is having pain in her right upper back, again this is been a chronic problem without acute change. She denies low back pain.  Past Medical History  Diagnosis Date  . Asthma   . Chronic lower back pain   . Chronic ear infection   . Kidney stones   . Hearing loss in left ear   . Heroin addiction   . Marijuana dependence     Past Surgical History  Procedure Date  . Dilation and curettage of uterus   . Tubal ligation   . Abdominal hysterectomy     No family history on file.  History  Substance Use Topics  . Smoking status: Current Everyday Smoker -- 1.0 packs/day for 15 years    Types: Cigarettes  . Smokeless tobacco: Not on file  . Alcohol Use: Yes     occasional    OB History    Grav Para Term Preterm Abortions TAB SAB Ect Mult Living                  Review of Systems  All other systems reviewed and are negative.    Allergies  Amoxicillin; Eggs or egg-derived products; Penicillins; and Wheat bran  Home Medications   Current Outpatient Rx  Name Route Sig Dispense Refill  . ALBUTEROL SULFATE HFA 108 (90 BASE) MCG/ACT IN AERS Inhalation Inhale 2 puffs into the lungs every 6 (six) hours as needed. For shortness of  breath    . BUPRENORPHINE HCL-NALOXONE HCL 2-0.5 MG SL SUBL Sublingual Place 1 tablet under the tongue daily. Last dose Monday 04/03/12    . CLONAZEPAM 1 MG PO TABS Oral Take 1 mg by mouth 2 (two) times daily.    Marland Kitchen CLONIDINE HCL 0.1 MG PO TABS Oral Take 0.1 mg by mouth 2 (two) times daily.    . CYCLOBENZAPRINE HCL 5 MG PO TABS Oral Take 5 mg by mouth 3 (three) times daily as needed. For muscle spasms    . GABAPENTIN 300 MG PO CAPS Oral Take 300-600 mg by mouth 3 (three) times daily. 300 mg twice daily and 600 mg at bedtime    . HYDROXYZINE PAMOATE 50 MG PO CAPS Oral Take 50 mg by mouth 4 (four) times daily. For anxiety    . IBUPROFEN 200 MG PO TABS Oral Take 600 mg by mouth 3 (three) times daily as needed. For pain    . PRESCRIPTION MEDICATION Both Eyes Place 2 drops into both eyes every 6 (six) hours as needed. Allergy eye drops    . SERTRALINE HCL 100 MG PO TABS Oral Take 200 mg by mouth daily.    . TRAZODONE HCL 50 MG PO TABS Oral Take 50 mg by mouth at bedtime.  BP 100/69  Pulse 70  Temp 98.1 F (36.7 C) (Oral)  Resp 20  Ht 5\' 6"  (1.676 m)  Wt 163 lb (73.936 kg)  BMI 26.31 kg/m2  SpO2 97%  LMP 03/21/2012  Physical Exam General: Well-developed, well-nourished female in no acute distress; appearance consistent with age of record HENT: normocephalic, atraumatic Eyes: pupils equal round and reactive to light; extraocular muscles intact Neck: supple Heart: regular rate and rhythm Lungs: clear to auscultation bilaterally Abdomen: soft; nondistended; nontender Extremities: No deformity; medial swelling and anterior tenderness of left ankle with decreased range of motion due to pain, left foot distally neurovascularly intact Neurologic: Sleeping but arousable; motor function intact in all extremities and symmetric; no facial droop; dysarthria Skin: Warm and dry     ED Course  Procedures (including critical care time)     MDM   Nursing notes and vitals signs, including  pulse oximetry, reviewed.  Summary of this visit's results, reviewed by myself:   Imaging Studies: Dg Ankle Complete Left  08/17/12  *RADIOLOGY REPORT*  Clinical Data: Left ankle pain/injury.  LEFT ANKLE COMPLETE - 3+ VIEW  Comparison: None.  Findings: Diffuse soft tissue swelling about the ankle.  No displaced fracture or dislocation.  Ankle mortise intact.  The  IMPRESSION: Soft tissue swelling.  No acute osseous abnormality. If clinical concern for a fracture persists, recommend a repeat radiograph in 5- 10 days to evaluate for interval change or callus formation.   Original Report Authenticated By: Waneta Martins, M.D.    [place]vo[place].Dace AM Patient appears to be under the influence of some intoxicant. Due to her heroin addiction history and current Suboxone therapy we'll we will treat only with nonsteroidal anti-inflammatory drugs.         Hanley Seamen, MD 2012/08/17 0157  Carlisle Beers Shacola Schussler, MD 08-17-12 [phone]

## 2012-08-01 ENCOUNTER — Ambulatory Visit (INDEPENDENT_AMBULATORY_CARE_PROVIDER_SITE_OTHER): Payer: Medicaid Other | Admitting: Family Medicine

## 2012-08-01 ENCOUNTER — Encounter: Payer: Self-pay | Admitting: Family Medicine

## 2012-08-01 ENCOUNTER — Ambulatory Visit (HOSPITAL_BASED_OUTPATIENT_CLINIC_OR_DEPARTMENT_OTHER)
Admission: RE | Admit: 2012-08-01 | Discharge: 2012-08-01 | Disposition: A | Discharge status: Home / Self Care | Payer: Medicaid Other | Source: Ambulatory Visit | Attending: Family Medicine | Admitting: Family Medicine

## 2012-08-01 VITALS — BP 100/68 | HR 87 | Ht 66.0 in | Wt 160.0 lb

## 2012-08-01 DIAGNOSIS — S99929A Unspecified injury of unspecified foot, initial encounter: Secondary | ICD-10-CM

## 2012-08-01 DIAGNOSIS — S99922A Unspecified injury of left foot, initial encounter: Secondary | ICD-10-CM

## 2012-08-01 DIAGNOSIS — X58XXXA Exposure to other specified factors, initial encounter: Secondary | ICD-10-CM | POA: Insufficient documentation

## 2012-08-01 DIAGNOSIS — S99912A Unspecified injury of left ankle, initial encounter: Secondary | ICD-10-CM

## 2012-08-01 DIAGNOSIS — S8990XA Unspecified injury of unspecified lower leg, initial encounter: Secondary | ICD-10-CM | POA: Insufficient documentation

## 2012-08-01 NOTE — Patient Instructions (Addendum)
You have an ankle sprain. Ice the area for 15 minutes at a time, 3-4 times a day Take ibuprofen as you have been. Percocet as needed for severe pain (no driving on this medication). Elevate above the level of your heart when possible Crutches if needed to help with walking Bear weight when tolerated Laceup ankle brace to help with stability while you recover from this injury. Come out of the boot/brace twice a day to do Up/down and alphabet exercises 2-3 sets of each. Consider physical therapy for strengthening and balance exercises once your motion starts to return (anticipate starting this in 2 weeks). Follow up with me in 2 weeks for reevaluation.

## 2012-08-03 ENCOUNTER — Encounter: Payer: Self-pay | Admitting: Family Medicine

## 2012-08-03 DIAGNOSIS — S99912A Unspecified injury of left ankle, initial encounter: Secondary | ICD-10-CM | POA: Insufficient documentation

## 2012-08-03 NOTE — Assessment & Plan Note (Signed)
radiographs repeated today negative for foot or ankle fracture.  Ultrasound used to ensure post tib, peroneal tendons were intact as she has fairly limited motion of ankle - no evidence of tears to these tendons.  Ibuprofen, icing, elevation, percocet 5/325 q6h prn severe pain (#60) - will not refill this medication.  Start HEP as demonstrated today.  Bear weight when tolerated.  ASO, crutches.  F/u in 2 weeks.  Insurance will only cover 3 PT visits so will wait until she has better motion, less pain to start this.

## 2012-08-03 NOTE — Progress Notes (Signed)
Subjective:    Patient ID: Jennifer Ballard, female    DOB: [birth year], 31 y.o.   MRN: [id number]  PCP: Caffie Damme  HPI 31 yo F here for left ankle injury  Patient reports on 8/24 she was coming down some concrete stairs when she hit a ledge and inverted her left ankle. Has known 'circulation problems' that sound like neuropathy and cause legs to go numb at times - on gabapentin. This time right leg went numb and caused her to invert left ankle. H/o ankle sprain in the past. Pain primarily lateral. Has been taking ibuprofen and icing. Not doing a HEP. Has ASO that was provided by ED. Radiographs in ED negative for fracture.  Past Medical History  Diagnosis Date  . Asthma   . Chronic lower back pain   . Chronic ear infection   . Kidney stones   . Hearing loss in left ear   . Heroin addiction   . Marijuana dependence   . Hypertension   . Hyperlipidemia     Current Outpatient Prescriptions on File Prior to Visit  Medication Sig Dispense Refill  . albuterol (PROVENTIL HFA;VENTOLIN HFA) 108 (90 BASE) MCG/ACT inhaler Inhale 2 puffs into the lungs every 6 (six) hours as needed. For shortness of breath      . buprenorphine-naloxone (SUBOXONE) 2-0.5 MG SUBL Place 1 tablet under the tongue daily. Last dose Monday 04/03/12      . clonazePAM (KLONOPIN) 1 MG tablet Take 1 mg by mouth 2 (two) times daily.      . cloNIDine (CATAPRES) 0.1 MG tablet Take 0.1 mg by mouth 2 (two) times daily.      . cyclobenzaprine (FLEXERIL) 5 MG tablet Take 5 mg by mouth 3 (three) times daily as needed. For muscle spasms      . gabapentin (NEURONTIN) 300 MG capsule Take 300-600 mg by mouth 3 (three) times daily. 300 mg twice daily and 600 mg at bedtime      . hydrOXYzine (VISTARIL) 50 MG capsule Take 50 mg by mouth 4 (four) times daily. For anxiety      . ibuprofen (ADVIL,MOTRIN) 200 MG tablet Take 600 mg by mouth 3 (three) times daily as needed. For pain      . naproxen (NAPROSYN) 500 MG tablet Take 1 tablet  twice daily as needed for pain.  30 tablet  0  . PRESCRIPTION MEDICATION Place 2 drops into both eyes every 6 (six) hours as needed. Allergy eye drops      . sertraline (ZOLOFT) 100 MG tablet Take 200 mg by mouth daily.      . traZODone (DESYREL) 50 MG tablet Take 50 mg by mouth at bedtime.        Past Surgical History  Procedure Date  . Dilation and curettage of uterus   . Tubal ligation   . Abdominal hysterectomy     Allergies  Allergen Reactions  . Amoxicillin Nausea And Vomiting  . Eggs Or Egg-Derived Products Other (See Comments)    Positive rxn to allergy test  . Penicillins Nausea And Vomiting  . Wheat Bran Other (See Comments)    Positive rxn to allergy test     History   Social History  . Marital Status: Married    Spouse Name: N/A    Number of Children: N/A  . Years of Education: N/A   Occupational History  . Not on file.   Social History Main Topics  . Smoking status: Current Everyday Smoker -- 1.0  packs/day for 15 years    Types: Cigarettes  . Smokeless tobacco: Not on file  . Alcohol Use: Yes     occasional  . Drug Use: No     heroin states she doesn't use  . Sexually Active: Yes    Birth Control/ Protection: Surgical   Other Topics Concern  . Not on file   Social History Narrative  . No narrative on file    History reviewed. No pertinent family history.  BP 100/68  Pulse 87  Ht 5\' 6"  (1.676 m)  Wt 160 lb (72.576 kg)  BMI 25.82 kg/m2  LMP 03/21/2012  Review of Systems See HPI above.    Objective:   Physical Exam Gen: NAD  L ankle: Mild swelling laterally.  No other deformity.  No ecchymoses.  Mod limitation in motion all planes. TTP greatest over ATFL, less at lat malleolus and base 5th MT.  No navicular, med malleolus, fib head, other ankle/foot TTP. 1+ ant drawer and talar tilt (painful). Negative syndesmotic compression. Thompsons test negative. NV intact distally.    Assessment & Plan:  1. Left ankle injury - radiographs  repeated today negative for foot or ankle fracture.  Ultrasound used to ensure post tib, peroneal tendons were intact as she has fairly limited motion of ankle - no evidence of tears to these tendons.  Ibuprofen, icing, elevation, percocet 5/325 q6h prn severe pain (#60) - will not refill this medication.  Start HEP as demonstrated today.  Bear weight when tolerated.  ASO, crutches.  F/u in 2 weeks.  Insurance will only cover 3 PT visits so will wait until she has better motion, less pain to start this.

## 2012-08-15 ENCOUNTER — Ambulatory Visit (INDEPENDENT_AMBULATORY_CARE_PROVIDER_SITE_OTHER): Payer: Medicaid Other | Admitting: Family Medicine

## 2012-08-15 ENCOUNTER — Ambulatory Visit: Payer: Medicaid Other | Admitting: Family Medicine

## 2012-08-15 ENCOUNTER — Encounter: Payer: Self-pay | Admitting: Family Medicine

## 2012-08-15 VITALS — BP 104/78 | Ht 66.0 in | Wt 160.0 lb

## 2012-08-15 DIAGNOSIS — S99912A Unspecified injury of left ankle, initial encounter: Secondary | ICD-10-CM

## 2012-08-15 DIAGNOSIS — S8990XA Unspecified injury of unspecified lower leg, initial encounter: Secondary | ICD-10-CM

## 2012-08-15 NOTE — Patient Instructions (Addendum)
You have an ankle sprain. Ice the area for 15 minutes at a time, 3-4 times a day Take ibuprofen as you have been after your colonoscopy Norco as needed for severe pain (no driving on this medication). Elevate above the level of your heart when possible Crutches if needed to help with walking Bear weight when tolerated Laceup ankle brace to help with stability while you recover from this injury. Come out of the boot/brace twice a day to do Up/down and alphabet exercises 2-3 sets of each twice a day. When able start the theraband exercises each direction. Consider physical therapy for strengthening and balance exercises once your motion starts to return. Follow up after October 5th

## 2012-08-16 ENCOUNTER — Encounter: Payer: Self-pay | Admitting: Family Medicine

## 2012-08-16 NOTE — Progress Notes (Addendum)
Subjective:    Patient ID: Jennifer Ballard, female    DOB: [birth year], 31 y.o.   MRN: [id number]  PCP: Caffie Damme  HPI  31 yo F here for f/u left ankle injury  9/3: Patient reports on 8/24 she was coming down some concrete stairs when she hit a ledge and inverted her left ankle. Has known 'circulation problems' that sound like neuropathy and cause legs to go numb at times - on gabapentin. This time right leg went numb and caused her to invert left ankle. H/o ankle sprain in the past. Pain primarily lateral. Has been taking ibuprofen and icing. Not doing a HEP. Has ASO that was provided by ED. Radiographs in ED negative for fracture.  9/17: Patient has improved a little since last visit. Taking percocet as needed for pain - almost out of this. Not taking ibuprofen now because she's having a colonoscopy this week. Has been wearing ASO and icing. Elevating as much as possible. Doing ROM exercises but hard to turn foot in and out.  Past Medical History  Diagnosis Date  . Asthma   . Chronic lower back pain   . Chronic ear infection   . Kidney stones   . Hearing loss in left ear   . Heroin addiction   . Marijuana dependence   . Hypertension   . Hyperlipidemia     Current Outpatient Prescriptions on File Prior to Visit  Medication Sig Dispense Refill  . albuterol (PROVENTIL HFA;VENTOLIN HFA) 108 (90 BASE) MCG/ACT inhaler Inhale 2 puffs into the lungs every 6 (six) hours as needed. For shortness of breath      . buprenorphine-naloxone (SUBOXONE) 2-0.5 MG SUBL Place 1 tablet under the tongue daily. Last dose Monday 04/03/12      . clonazePAM (KLONOPIN) 1 MG tablet Take 1 mg by mouth 2 (two) times daily.      . cloNIDine (CATAPRES) 0.1 MG tablet Take 0.1 mg by mouth 2 (two) times daily.      . cyclobenzaprine (FLEXERIL) 5 MG tablet Take 5 mg by mouth 3 (three) times daily as needed. For muscle spasms      . gabapentin (NEURONTIN) 300 MG capsule Take 300-600 mg by mouth 3 (three)  times daily. 300 mg twice daily and 600 mg at bedtime      . hydrOXYzine (VISTARIL) 50 MG capsule Take 50 mg by mouth 4 (four) times daily. For anxiety      . ibuprofen (ADVIL,MOTRIN) 200 MG tablet Take 600 mg by mouth 3 (three) times daily as needed. For pain      . naproxen (NAPROSYN) 500 MG tablet Take 1 tablet twice daily as needed for pain.  30 tablet  0  . PRESCRIPTION MEDICATION Place 2 drops into both eyes every 6 (six) hours as needed. Allergy eye drops      . sertraline (ZOLOFT) 100 MG tablet Take 200 mg by mouth daily.      . traZODone (DESYREL) 50 MG tablet Take 50 mg by mouth at bedtime.        Past Surgical History  Procedure Date  . Dilation and curettage of uterus   . Tubal ligation   . Abdominal hysterectomy     Allergies  Allergen Reactions  . Amoxicillin Nausea And Vomiting  . Eggs Or Egg-Derived Products Other (See Comments)    Positive rxn to allergy test  . Penicillins Nausea And Vomiting  . Wheat Bran Other (See Comments)    Positive rxn to allergy test  History   Social History  . Marital Status: Married    Spouse Name: N/A    Number of Children: N/A  . Years of Education: N/A   Occupational History  . Not on file.   Social History Main Topics  . Smoking status: Current Every Day Smoker -- 1.0 packs/day for 15 years    Types: Cigarettes  . Smokeless tobacco: Not on file  . Alcohol Use: Yes     occasional  . Drug Use: No     heroin states she doesn't use  . Sexually Active: Yes    Birth Control/ Protection: Surgical   Other Topics Concern  . Not on file   Social History Narrative  . No narrative on file    History reviewed. No pertinent family history.  BP 104/78  Ht 5\' 6"  (1.676 m)  Wt 160 lb (72.576 kg)  BMI 25.82 kg/m2  LMP 03/21/2012  Review of Systems  See HPI above.    Objective:   Physical Exam  Gen: NAD  L ankle: Mild swelling laterally.  Mod swelling rest of leg - 1+ edema tibia.  No other deformity.  No  ecchymoses.  Mod limitation in motion all planes. TTP greatest over ATFL, less at lat malleolus.  No base 5th MT TTP.  No navicular, med malleolus, fib head, other ankle/foot TTP. 1+ ant drawer and talar tilt (painful). Negative syndesmotic compression. Thompsons test negative. NV intact distally.    Assessment & Plan:  1. Left ankle injury - not much improvement from last visit despite meds, bracing, ROM exercises.  Radiographs x 2 negative for fracture.  U/s showed tendons were intact (though partial tear is possible).  Encouraged continued ROM exercises and add theraband exercises in about a week.  Ibuprofen after colonoscopy.  Norco 5/325 q6h prn severe pain #60 - advised would not refill this though feel she needs strong pain medicine given severe ankle sprain.  Icing, elevation.  F/u in 3 weeks when she is 6 weeks out.  May need to go ahead with MRI if not improving.  Note: Patient's intake form did not indicate she has a prior history of heroin or other drug use - did not list suboxone or prior treatment at [facility] either.  Prior ED notes also indicate earlier this year she had cocaine in a UDS.  She had reported to ED recently she wanted to stay away from narcotics and she is aware oxycodone, hydrocodone are narcotics.  Placed call to patient to discuss this but only got her voicemail.  Left message to call me back at direct line.  Addendum 9/23:  Spoke with patient.  Overall she is doing well - mother is rationing her pain medication so she does not overdose.  She is not taking suboxone.  Advised if she has any issues going forward to contact [facility].  Will not refill narcotic pain medication and will see her at f/u as we had previously discussed.

## 2012-08-16 NOTE — Assessment & Plan Note (Signed)
not much improvement from last visit despite meds, bracing, ROM exercises. Radiographs x 2 negative for fracture. U/s showed tendons were intact (though partial tear is possible). Encouraged continued ROM exercises and add theraband exercises in about a week. Ibuprofen after colonoscopy. Norco 5/325 q6h prn severe pain #60 - advised would not refill this though feel she needs strong pain medicine given severe ankle sprain. Icing, elevation. F/u in 3 weeks when she is 6 weeks out. May need to go ahead with MRI if not improving.

## 2013-07-10 ENCOUNTER — Emergency Department (HOSPITAL_BASED_OUTPATIENT_CLINIC_OR_DEPARTMENT_OTHER)
Admission: EM | Admit: 2013-07-10 | Discharge: 2013-07-10 | Disposition: A | Discharge status: Home / Self Care | Payer: Medicaid Other | Attending: Emergency Medicine | Admitting: Emergency Medicine

## 2013-07-10 ENCOUNTER — Encounter (HOSPITAL_BASED_OUTPATIENT_CLINIC_OR_DEPARTMENT_OTHER): Payer: Self-pay

## 2013-07-10 DIAGNOSIS — Z79899 Other long term (current) drug therapy: Secondary | ICD-10-CM | POA: Insufficient documentation

## 2013-07-10 DIAGNOSIS — I10 Essential (primary) hypertension: Secondary | ICD-10-CM | POA: Insufficient documentation

## 2013-07-10 DIAGNOSIS — H9190 Unspecified hearing loss, unspecified ear: Secondary | ICD-10-CM | POA: Insufficient documentation

## 2013-07-10 DIAGNOSIS — Z880 Allergy status to penicillin: Secondary | ICD-10-CM | POA: Insufficient documentation

## 2013-07-10 DIAGNOSIS — J45909 Unspecified asthma, uncomplicated: Secondary | ICD-10-CM | POA: Insufficient documentation

## 2013-07-10 DIAGNOSIS — F17200 Nicotine dependence, unspecified, uncomplicated: Secondary | ICD-10-CM | POA: Insufficient documentation

## 2013-07-10 DIAGNOSIS — Z8669 Personal history of other diseases of the nervous system and sense organs: Secondary | ICD-10-CM | POA: Insufficient documentation

## 2013-07-10 DIAGNOSIS — G8929 Other chronic pain: Secondary | ICD-10-CM | POA: Insufficient documentation

## 2013-07-10 DIAGNOSIS — M25519 Pain in unspecified shoulder: Secondary | ICD-10-CM | POA: Insufficient documentation

## 2013-07-10 DIAGNOSIS — M62838 Other muscle spasm: Secondary | ICD-10-CM | POA: Insufficient documentation

## 2013-07-10 DIAGNOSIS — Z87442 Personal history of urinary calculi: Secondary | ICD-10-CM | POA: Insufficient documentation

## 2013-07-10 DIAGNOSIS — Z8639 Personal history of other endocrine, nutritional and metabolic disease: Secondary | ICD-10-CM | POA: Insufficient documentation

## 2013-07-10 DIAGNOSIS — Z862 Personal history of diseases of the blood and blood-forming organs and certain disorders involving the immune mechanism: Secondary | ICD-10-CM | POA: Insufficient documentation

## 2013-07-10 MED ORDER — CYCLOBENZAPRINE HCL 5 MG PO TABS: 5.0000 mg | ORAL_TABLET | Freq: Three times a day (TID) | ORAL | Status: DC | PRN

## 2013-07-10 NOTE — ED Provider Notes (Signed)
CSN: [id number]     Arrival date & time 07/10/13  1425 History     None    Chief Complaint  Patient presents with  . Neck Pain   HPI  Jennifer Ballard is a 32 year old female with a PMH of asthma, chronic lower back pain, kidney stones, HTN, HLD, and substance abuse who presents to the ED for evaluation of neck pain.  Patient states that yesterday she was picking up a large laundry basket when she immediately developed left sided neck and shoulder pain. Her pain is described as pulling and tight sensation.  She denies any trauma.  Her pain has not improved since the onset.  Her pain is continuous.  Pain rated 8/10.  She has never had pain like this in the past and denies any history of neck problems.  She has tried over-the-counter Tylenol, ibuprofen, and hot and cold compresses without any relief.  She obtained a ride to the ED today because she is unable to drive due to her pain. She is unable to move her neck due to pain. She denies any numbness, tingling, loss of sensation, or loss of motor function. She has otherwise been well with no recent fever, chills, headache, dizziness, lightheadedness, change in activity or appetite, rhinorrhea, congestion, sore throat, cough, chest pain, shortness of breath, abdominal pain, nausea, vomiting, diarrhea, constipation, or dysuria.      Past Medical History  Diagnosis Date  . Asthma   . Chronic lower back pain   . Chronic ear infection   . Kidney stones   . Hearing loss in left ear   . Heroin addiction   . Marijuana dependence   . Hypertension   . Hyperlipidemia    Past Surgical History  Procedure Laterality Date  . Dilation and curettage of uterus    . Tubal ligation    . Abdominal hysterectomy     No family history on file. History  Substance Use Topics  . Smoking status: Current Every Day Smoker -- 1.00 packs/day for 15 years    Types: Cigarettes  . Smokeless tobacco: Not on file  . Alcohol Use: Yes     Comment: occasional   OB  History   Grav Para Term Preterm Abortions TAB SAB Ect Mult Living                 Review of Systems  Constitutional: Negative for fever, chills, activity change, appetite change and fatigue.  HENT: Positive for neck pain and neck stiffness. Negative for ear pain, congestion, sore throat and rhinorrhea.   Eyes: Negative for visual disturbance.  Respiratory: Negative for cough and shortness of breath.   Cardiovascular: Negative for chest pain and leg swelling.  Gastrointestinal: Negative for nausea, vomiting, abdominal pain, diarrhea and constipation.  Genitourinary: Negative for dysuria.  Musculoskeletal: Negative for back pain.  Skin: Negative for rash and wound.  Neurological: Negative for dizziness, syncope, weakness, light-headedness, numbness and headaches.    Allergies  Amoxicillin; Eggs or egg-derived products; Penicillins; and Wheat bran  Home Medications   Current Outpatient Rx  Name  Route  Sig  Dispense  Refill  . albuterol (PROVENTIL HFA;VENTOLIN HFA) 108 (90 BASE) MCG/ACT inhaler   Inhalation   Inhale 2 puffs into the lungs every 6 (six) hours as needed. For shortness of breath         . buprenorphine-naloxone (SUBOXONE) 2-0.5 MG SUBL   Sublingual   Place 1 tablet under the tongue daily. Last dose Monday 04/03/12         .  clonazePAM (KLONOPIN) 1 MG tablet   Oral   Take 1 mg by mouth 2 (two) times daily.         . cloNIDine (CATAPRES) 0.1 MG tablet   Oral   Take 0.1 mg by mouth 2 (two) times daily.         . cyclobenzaprine (FLEXERIL) 5 MG tablet   Oral   Take 5 mg by mouth 3 (three) times daily as needed. For muscle spasms         . gabapentin (NEURONTIN) 300 MG capsule   Oral   Take 300-600 mg by mouth 3 (three) times daily. 300 mg twice daily and 600 mg at bedtime         . hydrOXYzine (VISTARIL) 50 MG capsule   Oral   Take 50 mg by mouth 4 (four) times daily. For anxiety         . ibuprofen (ADVIL,MOTRIN) 200 MG tablet   Oral   Take  600 mg by mouth 3 (three) times daily as needed. For pain         . naproxen (NAPROSYN) 500 MG tablet      Take 1 tablet twice daily as needed for pain.   30 tablet   0   . PRESCRIPTION MEDICATION   Both Eyes   Place 2 drops into both eyes every 6 (six) hours as needed. Allergy eye drops         . sertraline (ZOLOFT) 100 MG tablet   Oral   Take 200 mg by mouth daily.         . traZODone (DESYREL) 50 MG tablet   Oral   Take 50 mg by mouth at bedtime.          BP 133/90  Pulse 104  Temp(Src) 98.7 F (37.1 C) (Oral)  Resp 16  Ht 5\' 6"  (1.676 m)  Wt 165 lb (74.844 kg)  BMI 26.64 kg/m2  SpO2 96%  LMP 03/21/2012  Filed Vitals:   07/10/13 1429 07/10/13 1455 07/10/13 1516  BP: 133/90 118/85   Pulse: 104 102 88  Temp: 98.7 F (37.1 C)    TempSrc: Oral    Resp: 16 14   Height: 5\' 6"  (1.676 m)    Weight: 165 lb (74.844 kg)    SpO2: 96% 95%     Physical Exam  Nursing note and vitals reviewed. Constitutional: She is oriented to person, place, and time. She appears well-developed and well-nourished. No distress.  HENT:  Head: Normocephalic and atraumatic.  Right Ear: External ear normal.  Left Ear: External ear normal.  Nose: Nose normal.  Mouth/Throat: Oropharynx is clear and moist. No oropharyngeal exudate.  Eyes: Conjunctivae are normal. Pupils are equal, round, and reactive to light. Right eye exhibits no discharge. Left eye exhibits no discharge.  Neck: Neck supple.  Patient unable to move neck due to pain.  Tenderness to palpation in the left anterior, lateral, and posterior cervical neck diffusely with no focal tenderness.  No overlying external erythema, edema, ecchymosis, or wounds.  No carotid bruits bilaterally  Cardiovascular: Normal rate, regular rhythm and intact distal pulses.  Exam reveals no gallop and no friction rub.   No murmur heard. Radial pulses present and equal bilaterally  Pulmonary/Chest: Effort normal and breath sounds normal. No  respiratory distress. She has no wheezes. She has no rales. She exhibits no tenderness.  Abdominal: Soft. She exhibits no distension. There is no tenderness.  Musculoskeletal: Normal range of motion. She exhibits no  edema and no tenderness.  Diffuse tenderness to palpation to the left shoulder and scapula. No limitations with shoulder flexion, extension, abduction, adduction, and internal or external rotation.  No thoracic or lumbar spinal tenderness.      Lymphadenopathy:    She has no cervical adenopathy.  Neurological: She is alert and oriented to person, place, and time.  Gross sensation intact in the upper extremities.  Skin: Skin is warm and dry. She is not diaphoretic.     ED Course   Procedures (including critical care time)  Labs Reviewed - No data to display No results found. 1. Neck muscle spasm     MDM  Ayesha Markwell is a 32 year old female with a PMH of asthma, chronic lower back pain, kidney stones, HTN, HLD, and substance abuse who presents to the ED for evaluation of neck pain.    Etiology of neck pain likely due to to a muscle strain/spasm.  There were no external signs of trauma or infection on exam.  Patient was afebrile in the ED.  She was prescribed flexeril.  She was instructed not to drive or drinking alcohol while taking this medication.  Patient is neurovascularly intact.  She remained in no acute distress throughout her ED visit.  Patient was instructed to return to the ED if she develops a fever, severe headache, numbness/tingling, weakness, loss of sensation, repeated emesis, or other concerns.  Patient instructed to rest, use gentle stretching, and apply cold/warm compresses for symptomatic relief.  Her tachycardia improved throughout her ED visit and is likely due to pain.  Patient was instructed to follow-up with her PCP if her symptoms do not improve or worsen.  She was in agreement with plan and discharge.  Patient being driven home by her husband who is present  in the ED.     Final impression 1. Muscle spasm, neck     Thomasenia Sales    Jillyn Ledger, [place] 07/10/13 [phone]

## 2013-07-10 NOTE — ED Provider Notes (Signed)
Medical screening examination/treatment/procedure(s) were performed by non-physician practitioner and as supervising physician I was immediately available for consultation/collaboration.   Audree Camel, MD 07/10/13 [phone]

## 2013-07-10 NOTE — ED Notes (Signed)
Pt reports yesterday was picking up laundry basket when pulled a muscle in left trapezius area.  Pain worse with movement.

## 2013-10-02 ENCOUNTER — Emergency Department (HOSPITAL_BASED_OUTPATIENT_CLINIC_OR_DEPARTMENT_OTHER)
Admission: EM | Admit: 2013-10-02 | Discharge: 2013-10-02 | Disposition: A | Discharge status: Home / Self Care | Payer: Medicaid Other | Attending: Emergency Medicine | Admitting: Emergency Medicine

## 2013-10-02 ENCOUNTER — Encounter (HOSPITAL_BASED_OUTPATIENT_CLINIC_OR_DEPARTMENT_OTHER): Payer: Self-pay | Admitting: Emergency Medicine

## 2013-10-02 DIAGNOSIS — H109 Unspecified conjunctivitis: Secondary | ICD-10-CM | POA: Insufficient documentation

## 2013-10-02 DIAGNOSIS — Z880 Allergy status to penicillin: Secondary | ICD-10-CM | POA: Insufficient documentation

## 2013-10-02 DIAGNOSIS — Z8659 Personal history of other mental and behavioral disorders: Secondary | ICD-10-CM | POA: Insufficient documentation

## 2013-10-02 DIAGNOSIS — J45909 Unspecified asthma, uncomplicated: Secondary | ICD-10-CM | POA: Insufficient documentation

## 2013-10-02 DIAGNOSIS — Z862 Personal history of diseases of the blood and blood-forming organs and certain disorders involving the immune mechanism: Secondary | ICD-10-CM | POA: Insufficient documentation

## 2013-10-02 DIAGNOSIS — I10 Essential (primary) hypertension: Secondary | ICD-10-CM | POA: Insufficient documentation

## 2013-10-02 DIAGNOSIS — Z87442 Personal history of urinary calculi: Secondary | ICD-10-CM | POA: Insufficient documentation

## 2013-10-02 DIAGNOSIS — Z8639 Personal history of other endocrine, nutritional and metabolic disease: Secondary | ICD-10-CM | POA: Insufficient documentation

## 2013-10-02 DIAGNOSIS — F17200 Nicotine dependence, unspecified, uncomplicated: Secondary | ICD-10-CM | POA: Insufficient documentation

## 2013-10-02 DIAGNOSIS — G8929 Other chronic pain: Secondary | ICD-10-CM | POA: Insufficient documentation

## 2013-10-02 MED ORDER — SULFACETAMIDE SODIUM 10 % OP SOLN
OPHTHALMIC | Status: AC
  Filled 2013-10-02: qty 15

## 2013-10-02 MED ORDER — SULFACETAMIDE SODIUM 10 % OP SOLN
1.0000 [drp] | Freq: Four times a day (QID) | OPHTHALMIC | Status: DC
  Administered 2013-10-02: 1 [drp] via OPHTHALMIC

## 2013-10-02 NOTE — ED Provider Notes (Signed)
CSN: [id number]     Arrival date & time 10/02/13  0808 History   First MD Initiated Contact with Patient 10/02/13 [phone]     Chief Complaint  Patient presents with  . Eye Problem   (Consider location/radiation/quality/duration/timing/severity/associated sxs/prior Treatment) Patient is a 32 y.o. female presenting with eye problem.  Eye Problem Associated symptoms: discharge, itching and redness   Associated symptoms: no nausea, no photophobia and no vomiting     32 y/o female here with L eye burning pain and discharge. Her pain started last night without trauma  Or concern for foreign body. She does not wear contacts. She states that when she woke up she had redness in the L  Lower side of her L eye andsmall amount of thick yellow discharge. She used pataday drops and the redness and itching improved significantly. Recently several people in her family have had pink eye including her two kids and her great aunt she visits weakly. She denies loss of vision, fever, chills, sweats, chest pain, dyspnea, weakness, nausea, vomiting, and trouble walking.   Past Medical History  Diagnosis Date  . Asthma   . Chronic lower back pain   . Chronic ear infection   . Kidney stones   . Hearing loss in left ear   . Heroin addiction   . Marijuana dependence   . Hypertension   . Hyperlipidemia    Past Surgical History  Procedure Laterality Date  . Dilation and curettage of uterus    . Tubal ligation    . Abdominal hysterectomy     History reviewed. No pertinent family history. History  Substance Use Topics  . Smoking status: Current Every Day Smoker -- 1.00 packs/day for 15 years    Types: Cigarettes  . Smokeless tobacco: Not on file  . Alcohol Use: Yes     Comment: occasional   OB History   Grav Para Term Preterm Abortions TAB SAB Ect Mult Living                 Review of Systems  Constitutional: Negative for fever and chills.  Eyes: Positive for pain, discharge, redness and itching.  Negative for photophobia.  Respiratory: Negative for cough and shortness of breath.   Cardiovascular: Negative for chest pain.  Gastrointestinal: Negative for nausea and vomiting.  All other systems reviewed and are negative.    Allergies  Amoxicillin; Eggs or egg-derived products; Penicillins; and Wheat bran  Home Medications  No current outpatient prescriptions on file. BP 122/77  Pulse 94  Temp(Src) 97.6 F (36.4 C) (Oral)  Resp 16  SpO2 100%  LMP 03/21/2012 Physical Exam  Constitutional: She is oriented to person, place, and time. She appears well-developed and well-nourished. No distress.  HENT:  Head: Normocephalic and atraumatic.  Eyes: EOM and lids are normal. Pupils are equal, round, and reactive to light. Right eye exhibits no discharge, no exudate and no hordeolum. No foreign body present in the right eye. Left eye exhibits no discharge and no exudate. No foreign body present in the left eye. Right conjunctiva is not injected. Left conjunctiva is injected. No scleral icterus.  Fundoscopic exam:      The right eye shows red reflex.       The left eye shows red reflex.  Cardiovascular: Normal rate and regular rhythm.   No murmur heard. Pulmonary/Chest: Effort normal. No respiratory distress.  Abdominal: Soft. Bowel sounds are normal. There is no tenderness. There is no guarding.  Neurological: She is alert  and oriented to person, place, and time.  Skin: Skin is warm and dry. She is not diaphoretic.  Psychiatric: She has a normal mood and affect.    ED Course  Procedures (including critical care time) Labs Review Labs Reviewed - No data to display Imaging Review No results found.  EKG Interpretation   None       MDM   1. Conjunctivitis    With recent family Hx this is likely viral conjunctivitis but will cover for bacterial etiology with Bleph -10 with discharge.   - Eye without red flags - safe for dc, return for worsening symptoms and with PCP.     Elenora Gamma, MD 10/02/13 1610  Elenora Gamma, MD 10/02/13 [phone]

## 2013-10-02 NOTE — ED Provider Notes (Signed)
I saw and evaluated the patient, reviewed the resident's note and I agree with the findings and plan.  EKG Interpretation   None       Pt with itching and redness L eye with reported drainage this morning. Kids recently had pink eye. Minimal conjunctival injection here. Anterior chambers clear. Abx drops. Pt requesting work note.   Cedrik Heindl B. Bernette Mayers, MD 10/02/13 [phone]

## 2014-02-14 ENCOUNTER — Encounter (HOSPITAL_BASED_OUTPATIENT_CLINIC_OR_DEPARTMENT_OTHER): Payer: Self-pay | Admitting: Emergency Medicine

## 2014-02-14 ENCOUNTER — Emergency Department (HOSPITAL_BASED_OUTPATIENT_CLINIC_OR_DEPARTMENT_OTHER)
Admission: EM | Admit: 2014-02-14 | Discharge: 2014-02-14 | Disposition: A | Discharge status: Home / Self Care | Payer: Medicaid Other | Attending: Emergency Medicine | Admitting: Emergency Medicine

## 2014-02-14 DIAGNOSIS — H9190 Unspecified hearing loss, unspecified ear: Secondary | ICD-10-CM | POA: Insufficient documentation

## 2014-02-14 DIAGNOSIS — Z862 Personal history of diseases of the blood and blood-forming organs and certain disorders involving the immune mechanism: Secondary | ICD-10-CM | POA: Insufficient documentation

## 2014-02-14 DIAGNOSIS — I10 Essential (primary) hypertension: Secondary | ICD-10-CM | POA: Insufficient documentation

## 2014-02-14 DIAGNOSIS — J45909 Unspecified asthma, uncomplicated: Secondary | ICD-10-CM | POA: Insufficient documentation

## 2014-02-14 DIAGNOSIS — Z880 Allergy status to penicillin: Secondary | ICD-10-CM | POA: Insufficient documentation

## 2014-02-14 DIAGNOSIS — F17200 Nicotine dependence, unspecified, uncomplicated: Secondary | ICD-10-CM | POA: Insufficient documentation

## 2014-02-14 DIAGNOSIS — Z87442 Personal history of urinary calculi: Secondary | ICD-10-CM | POA: Insufficient documentation

## 2014-02-14 DIAGNOSIS — H6120 Impacted cerumen, unspecified ear: Secondary | ICD-10-CM

## 2014-02-14 DIAGNOSIS — Z8639 Personal history of other endocrine, nutritional and metabolic disease: Secondary | ICD-10-CM | POA: Insufficient documentation

## 2014-02-14 DIAGNOSIS — G8929 Other chronic pain: Secondary | ICD-10-CM | POA: Insufficient documentation

## 2014-02-14 NOTE — Discharge Instructions (Signed)
Cerumen Impaction A cerumen impaction is when the wax in your ear forms a plug. This plug usually causes reduced hearing. Sometimes it also causes an earache or dizziness. Removing a cerumen impaction can be difficult and painful. The wax sticks to the ear canal. The canal is sensitive and bleeds easily. If you try to remove a heavy wax buildup with a cotton tipped swab, you may push it in further. Irrigation with water, suction, and small ear curettes may be used to clear out the wax. If the impaction is fixed to the skin in the ear canal, ear drops may be needed for a few days to loosen the wax. People who build up a lot of wax frequently can use ear wax removal products available in your local drugstore. SEEK MEDICAL CARE IF:  You develop an earache, increased hearing loss, or marked dizziness. Document Released: 12/23/2004 Document Revised: 02/07/2012 Document Reviewed: 02/12/2010 [employer] Patient Information 2014 [employer].  

## 2014-02-14 NOTE — ED Notes (Addendum)
C/o right ear ache x 4 days-has used "swimmers ear drops" and ibuprofen w/o reilef

## 2014-02-14 NOTE — ED Provider Notes (Signed)
CSN: [id number]     Arrival date & time 02/14/14  2223 History  This chart was scribed for Jennifer Ballard Jennifer Ballard Jennifer Mignano-Rasch, MD by Ardelia Memsylan Malpass, ED Scribe. This patient was seen in room MH05/MH05 and the patient's care was started at 11:06 PM.   Chief Complaint  Patient presents with  . Otalgia    Patient is a 33 y.o. female presenting with ear pain. The history is provided by the patient. No language interpreter was used.  Otalgia Location:  Right Behind ear:  No abnormality Quality:  Aching Severity:  Moderate Onset quality:  Gradual Duration:  4 days Timing:  Intermittent Progression:  Worsening Chronicity:  Recurrent Relieved by:  Nothing Worsened by:  Nothing tried Ineffective treatments:  OTC medications ("Swimmer's ear drops" and Ibuprofen) Associated symptoms: congestion  Hearing loss: decreased.     HPI Comments: Jennifer Ballard is a 33 y.o. female who presents to the Emergency Department complaining of gradually worsening right ear pain over the past 4 days. She reports that she has had associated decreased hearing on the right. She states that she has had multiple ear infections in the past.  She states that she has tried Ibuprofen and prescription "Swimmer's ear drops" without relief of her pain. She denies any other pain or symptoms.   Past Medical History  Diagnosis Date  . Asthma   . Chronic lower back pain   . Chronic ear infection   . Kidney stones   . Hearing loss in left ear   . Heroin addiction   . Marijuana dependence   . Hypertension   . Hyperlipidemia    Past Surgical History  Procedure Laterality Date  . Dilation and curettage of uterus    . Tubal ligation    . Abdominal hysterectomy     History reviewed. No pertinent family history. History  Substance Use Topics  . Smoking status: Current Every Day Smoker -- 1.00 packs/day for 15 years    Types: Cigarettes  . Smokeless tobacco: Not on file  . Alcohol Use: Yes     Comment: occasional   OB History    Grav Para Term Preterm Abortions TAB SAB Ect Mult Living                 Review of Systems  HENT: Positive for congestion and ear pain (right). Hearing loss: decreased.   All other systems reviewed and are negative.   Allergies  Amoxicillin; Eggs or egg-derived products; Penicillins; and Wheat bran  Home Medications   Current Outpatient Rx  Name  Route  Sig  Dispense  Refill  . CLONIDINE HCL PO   Oral   Take by mouth.          Triage Vitals: BP 118/82  Pulse 95  Temp(Src) 98.8 F (37.1 C) (Oral)  Resp 16  Ht 5\' 5"  (1.651 m)  Wt 160 lb (72.576 kg)  BMI 26.63 kg/m2  SpO2 98%  LMP 03/21/2012  Physical Exam  Nursing note and vitals reviewed. Constitutional: She is oriented to person, place, and time. She appears well-developed and well-nourished. No distress.  HENT:  Head: Normocephalic and atraumatic.  Mouth/Throat: Oropharynx is clear and moist.  Right TM impacted with cerumen. No fluid behind bilateral TMs. No mastoid tenderness. Nasal congestion and postnasal drip.  Eyes: EOM are normal. Pupils are equal, round, and reactive to light.  Neck: Normal range of motion. Neck supple. No tracheal deviation present.  Cardiovascular: Normal rate and regular rhythm.   Pulmonary/Chest: Effort  normal and breath sounds normal. No respiratory distress. She has no wheezes. She has no rales.  Abdominal: Soft. Bowel sounds are normal. There is no tenderness. There is no rebound.  Musculoskeletal: Normal range of motion.  Neurological: She is alert and oriented to person, place, and time.  Skin: Skin is warm and dry.  Psychiatric: She has a normal mood and affect. Her behavior is normal.    ED Course  Procedures (including critical care time)  DIAGNOSTIC STUDIES: Oxygen Saturation is 98% on RA, normal by my interpretation.    COORDINATION OF CARE: 11:10 PM- Pt advised of plan for treatment and pt agrees.  Labs Review Labs Reviewed - No data to display Imaging Review No  results found.   EKG Interpretation None      MDM   Final diagnoses:  Cerumen impaction    Recommend wax dissolving drops x 7 days.  No more Q tips   I personally performed the services described in this documentation, which was scribed in my presence. The recorded information has been reviewed and is accurate.    Jasmine Awe, MD 02/15/14 0020

## 2014-02-15 ENCOUNTER — Encounter (HOSPITAL_BASED_OUTPATIENT_CLINIC_OR_DEPARTMENT_OTHER): Payer: Self-pay | Admitting: Emergency Medicine

## 2014-03-14 ENCOUNTER — Emergency Department (HOSPITAL_BASED_OUTPATIENT_CLINIC_OR_DEPARTMENT_OTHER): Payer: Medicaid Other

## 2014-03-14 ENCOUNTER — Emergency Department (HOSPITAL_BASED_OUTPATIENT_CLINIC_OR_DEPARTMENT_OTHER)
Admission: EM | Admit: 2014-03-14 | Discharge: 2014-03-14 | Disposition: A | Discharge status: Home / Self Care | Payer: Medicaid Other | Attending: Emergency Medicine | Admitting: Emergency Medicine

## 2014-03-14 ENCOUNTER — Encounter (HOSPITAL_BASED_OUTPATIENT_CLINIC_OR_DEPARTMENT_OTHER): Payer: Self-pay | Admitting: Emergency Medicine

## 2014-03-14 DIAGNOSIS — Z8639 Personal history of other endocrine, nutritional and metabolic disease: Secondary | ICD-10-CM | POA: Insufficient documentation

## 2014-03-14 DIAGNOSIS — F17200 Nicotine dependence, unspecified, uncomplicated: Secondary | ICD-10-CM | POA: Insufficient documentation

## 2014-03-14 DIAGNOSIS — Z79899 Other long term (current) drug therapy: Secondary | ICD-10-CM | POA: Insufficient documentation

## 2014-03-14 DIAGNOSIS — J45909 Unspecified asthma, uncomplicated: Secondary | ICD-10-CM | POA: Insufficient documentation

## 2014-03-14 DIAGNOSIS — Z862 Personal history of diseases of the blood and blood-forming organs and certain disorders involving the immune mechanism: Secondary | ICD-10-CM | POA: Insufficient documentation

## 2014-03-14 DIAGNOSIS — I10 Essential (primary) hypertension: Secondary | ICD-10-CM | POA: Insufficient documentation

## 2014-03-14 DIAGNOSIS — N23 Unspecified renal colic: Secondary | ICD-10-CM

## 2014-03-14 DIAGNOSIS — Z880 Allergy status to penicillin: Secondary | ICD-10-CM | POA: Insufficient documentation

## 2014-03-14 DIAGNOSIS — G8929 Other chronic pain: Secondary | ICD-10-CM | POA: Insufficient documentation

## 2014-03-14 DIAGNOSIS — H9190 Unspecified hearing loss, unspecified ear: Secondary | ICD-10-CM | POA: Insufficient documentation

## 2014-03-14 DIAGNOSIS — Z9089 Acquired absence of other organs: Secondary | ICD-10-CM | POA: Insufficient documentation

## 2014-03-14 LAB — RAPID URINE DRUG SCREEN, HOSP PERFORMED
AMPHETAMINES: NOT DETECTED
BENZODIAZEPINES: NOT DETECTED
Barbiturates: NOT DETECTED
Cocaine: NOT DETECTED
Opiates: POSITIVE — AB
TETRAHYDROCANNABINOL: POSITIVE — AB

## 2014-03-14 LAB — URINALYSIS, ROUTINE W REFLEX MICROSCOPIC
Bilirubin Urine: NEGATIVE
GLUCOSE, UA: NEGATIVE mg/dL
KETONES UR: 15 mg/dL — AB
Nitrite: NEGATIVE
PH: 5.5 (ref 5.0–8.0)
Protein, ur: NEGATIVE mg/dL
Specific Gravity, Urine: 1.017 (ref 1.005–1.030)
Urobilinogen, UA: 0.2 mg/dL (ref 0.0–1.0)

## 2014-03-14 LAB — URINE MICROSCOPIC-ADD ON

## 2014-03-14 MED ORDER — TAMSULOSIN HCL 0.4 MG PO CAPS: ORAL_CAPSULE | ORAL | Status: DC

## 2014-03-14 MED ORDER — TAMSULOSIN HCL 0.4 MG PO CAPS
0.4000 mg | ORAL_CAPSULE | Freq: Once | ORAL | Status: AC
Start: 2014-03-14 — End: 2014-03-14
  Administered 2014-03-14: 0.4 mg via ORAL
  Filled 2014-03-14: qty 1

## 2014-03-14 MED ORDER — SODIUM CHLORIDE 0.9 % IV SOLN
INTRAVENOUS | Status: DC
  Administered 2014-03-14: 04:00:00 via INTRAVENOUS

## 2014-03-14 MED ORDER — KETOROLAC TROMETHAMINE 30 MG/ML IJ SOLN
30.0000 mg | Freq: Once | INTRAMUSCULAR | Status: AC
  Administered 2014-03-14: 30 mg via INTRAVENOUS
  Filled 2014-03-14: qty 1

## 2014-03-14 MED ORDER — NAPROXEN SODIUM 550 MG PO TABS: ORAL_TABLET | ORAL | Status: DC

## 2014-03-14 MED ORDER — ONDANSETRON 8 MG PO TBDP: 8.0000 mg | ORAL_TABLET | Freq: Three times a day (TID) | ORAL | Status: DC | PRN

## 2014-03-14 MED ORDER — ONDANSETRON HCL 4 MG/2ML IJ SOLN
4.0000 mg | Freq: Once | INTRAMUSCULAR | Status: AC
  Administered 2014-03-14: 4 mg via INTRAVENOUS
  Filled 2014-03-14: qty 2

## 2014-03-14 NOTE — ED Notes (Signed)
Pt c/o RLQ pain, nausea, vomiting, states it is similar to previous kidney stones.

## 2014-03-14 NOTE — ED Provider Notes (Signed)
CSN: [id number]     Arrival date & time 03/14/14  [id number] History   First MD Initiated Contact with Patient 03/14/14 [phone]     Chief Complaint  Patient presents with  . Abdominal Pain     (Consider location/radiation/quality/duration/timing/severity/associated sxs/prior Treatment) HPI This is a 33 year old female with a history of kidney stones and heroin addiction. She is here with a one-hour history of right lower quadrant pain which she rates as severe. It is characterized as like previous kidney stones. It has been associated with nausea and vomiting. It is somewhat worse with palpation. She is not aware of having hematuria but nursing staff reports her urine specimen appeared to have blood in it. She admits to abusing narcotics as recently as 2 days ago.  Past Medical History  Diagnosis Date  . Asthma   . Chronic lower back pain   . Chronic ear infection   . Kidney stones   . Hearing loss in left ear   . Marijuana dependence   . Hypertension   . Hyperlipidemia   . Heroin addiction    Past Surgical History  Procedure Laterality Date  . Dilation and curettage of uterus    . Tubal ligation    . Abdominal hysterectomy     History reviewed. No pertinent family history. History  Substance Use Topics  . Smoking status: Current Every Day Smoker -- 1.00 packs/day for 15 years    Types: Cigarettes  . Smokeless tobacco: Not on file  . Alcohol Use: Yes     Comment: occasional   OB History   Grav Para Term Preterm Abortions TAB SAB Ect Mult Living                 Review of Systems  All other systems reviewed and are negative.  Allergies  Amoxicillin; Eggs or egg-derived products; Penicillins; and Wheat bran  Home Medications   Prior to Admission medications   Medication Sig Start Date End Date Taking? Authorizing Provider  ibuprofen (ADVIL,MOTRIN) 200 MG tablet Take 200 mg by mouth every 6 (six) hours as needed.   Yes Historical Provider, MD  loratadine (CLARITIN) 10 MG  tablet Take 10 mg by mouth daily.   Yes Historical Provider, MD  CLONIDINE HCL PO Take by mouth.    Historical Provider, MD   BP 105/79  Pulse 60  Temp(Src) 98.5 F (36.9 C) (Oral)  Resp 22  Ht 5\' 6"  (1.676 m)  Wt 160 lb (72.576 kg)  BMI 25.84 kg/m2  SpO2 98%  LMP 03/21/2012  Physical Exam General: Well-developed, well-nourished female in no acute distress; appearance consistent with age of record; appears uncomfortable HENT: normocephalic; atraumatic Eyes: pupils equal, round and reactive to light; extraocular muscles intact Neck: supple Heart: regular rate and rhythm Lungs: clear to auscultation bilaterally Abdomen: soft; nondistended; right lower quadrant tenderness; no masses or hepatosplenomegaly; bowel sounds present GU: Right CVA tenderness Extremities: No deformity; full range of motion; pulses normal Neurologic: Awake, alert and oriented; motor function intact in all extremities and symmetric; no facial droop Skin: Warm and dry; track marks on arms Psychiatric: Anxious, agitated  ED Course  Procedures (including critical care time)   MDM   Nursing notes and vitals signs, including pulse oximetry, reviewed.  Summary of this visit's results, reviewed by myself:  Labs:  Results for orders placed during the hospital encounter of 03/14/14 (from the past 24 hour(s))  URINALYSIS, ROUTINE W REFLEX MICROSCOPIC     Status: Abnormal   Collection Time  03/14/14  3:30 AM      Result Value Ref Range   Color, Urine RED (*) YELLOW   APPearance TURBID (*) CLEAR   Specific Gravity, Urine 1.017  1.005 - 1.030   pH 5.5  5.0 - 8.0   Glucose, UA NEGATIVE  NEGATIVE mg/dL   Hgb urine dipstick LARGE (*) NEGATIVE   Bilirubin Urine NEGATIVE  NEGATIVE   Ketones, ur 15 (*) NEGATIVE mg/dL   Protein, ur NEGATIVE  NEGATIVE mg/dL   Urobilinogen, UA 0.2  0.0 - 1.0 mg/dL   Nitrite NEGATIVE  NEGATIVE   Leukocytes, UA MODERATE (*) NEGATIVE  URINE RAPID DRUG SCREEN (HOSP PERFORMED)      Status: Abnormal   Collection Time    03/14/14  3:30 AM      Result Value Ref Range   Opiates POSITIVE (*) NONE DETECTED   Cocaine NONE DETECTED  NONE DETECTED   Benzodiazepines NONE DETECTED  NONE DETECTED   Amphetamines NONE DETECTED  NONE DETECTED   Tetrahydrocannabinol POSITIVE (*) NONE DETECTED   Barbiturates NONE DETECTED  NONE DETECTED  URINE MICROSCOPIC-ADD ON     Status: Abnormal   Collection Time    03/14/14  3:30 AM      Result Value Ref Range   Squamous Epithelial / LPF FEW (*) RARE   WBC, UA 0-2  <3 WBC/hpf   RBC / HPF TOO NUMEROUS TO COUNT  <3 RBC/hpf   Bacteria, UA FEW (*) RARE   Crystals CA OXALATE CRYSTALS (*) NEGATIVE    Imaging Studies: Ct Abdomen Pelvis Wo Contrast  03/14/2014   CLINICAL DATA:  Right lower quadrant pain  EXAM: CT ABDOMEN AND PELVIS WITHOUT CONTRAST  TECHNIQUE: Multidetector CT imaging of the abdomen and pelvis was performed following the standard protocol without intravenous contrast.  COMPARISON:  CT ABD/PELVIS W CM dated 01/18/2012  FINDINGS: The lung bases are clear.  There is a 1 mm distal right ureteral calculus resulting in mild right hydroureteronephrosis. The kidneys are symmetric in size without evidence for exophytic mass. The bladder is unremarkable.  The liver demonstrates no focal abnormality. The gallbladder is unremarkable. The spleen demonstrates no focal abnormality. The adrenal glands and pancreas are normal.  The unopacified stomach, duodenum, small intestine and large intestine are unremarkable, but evaluation is limited by lack of oral contrast. There is no pneumoperitoneum, pneumatosis, or portal venous gas. There is no abdominal or pelvic free fluid. There is no lymphadenopathy. The uterus is surgically absent.  The abdominal aorta is normal in caliber.  The osseous structures are unremarkable.  IMPRESSION: 1. There is a 1 mm distal right ureteral calculus resulting in mild right hydroureteronephrosis.   Electronically Signed   By:  Elige KoHetal  Patel   On: 03/14/2014 04:49   Significant relief with IV Toradol and Zofran.   4:56 AM Patient continues to be pain-free. We will treat with nonsteroidal and Flomax. The patient understands that it is inappropriate to prescribe a narcotic given her ongoing narcotic abuse and she is comfortable with this plan. The stone is small enough that it should pass fairly rapidly.   Hanley SeamenJohn L Delaynie Stetzer, MD 03/14/14 [phone]

## 2014-03-14 NOTE — ED Notes (Signed)
MD at bedside. 

## 2014-08-10 ENCOUNTER — Emergency Department (HOSPITAL_BASED_OUTPATIENT_CLINIC_OR_DEPARTMENT_OTHER)
Admission: EM | Admit: 2014-08-10 | Discharge: 2014-08-10 | Disposition: A | Discharge status: Home / Self Care | Payer: Medicaid Other | Attending: Emergency Medicine | Admitting: Emergency Medicine

## 2014-08-10 ENCOUNTER — Encounter (HOSPITAL_BASED_OUTPATIENT_CLINIC_OR_DEPARTMENT_OTHER): Payer: Self-pay | Admitting: Emergency Medicine

## 2014-08-10 DIAGNOSIS — IMO0002 Reserved for concepts with insufficient information to code with codable children: Secondary | ICD-10-CM | POA: Insufficient documentation

## 2014-08-10 DIAGNOSIS — Z862 Personal history of diseases of the blood and blood-forming organs and certain disorders involving the immune mechanism: Secondary | ICD-10-CM | POA: Diagnosis not present

## 2014-08-10 DIAGNOSIS — G8929 Other chronic pain: Secondary | ICD-10-CM | POA: Insufficient documentation

## 2014-08-10 DIAGNOSIS — Z87442 Personal history of urinary calculi: Secondary | ICD-10-CM | POA: Diagnosis not present

## 2014-08-10 DIAGNOSIS — T07XXXA Unspecified multiple injuries, initial encounter: Secondary | ICD-10-CM | POA: Diagnosis not present

## 2014-08-10 DIAGNOSIS — Z880 Allergy status to penicillin: Secondary | ICD-10-CM | POA: Diagnosis not present

## 2014-08-10 DIAGNOSIS — H9190 Unspecified hearing loss, unspecified ear: Secondary | ICD-10-CM | POA: Insufficient documentation

## 2014-08-10 DIAGNOSIS — Y9389 Activity, other specified: Secondary | ICD-10-CM | POA: Insufficient documentation

## 2014-08-10 DIAGNOSIS — S239XXA Sprain of unspecified parts of thorax, initial encounter: Secondary | ICD-10-CM | POA: Diagnosis not present

## 2014-08-10 DIAGNOSIS — F17200 Nicotine dependence, unspecified, uncomplicated: Secondary | ICD-10-CM | POA: Insufficient documentation

## 2014-08-10 DIAGNOSIS — S233XXA Sprain of ligaments of thoracic spine, initial encounter: Secondary | ICD-10-CM

## 2014-08-10 DIAGNOSIS — T148XXA Other injury of unspecified body region, initial encounter: Secondary | ICD-10-CM

## 2014-08-10 DIAGNOSIS — Z8639 Personal history of other endocrine, nutritional and metabolic disease: Secondary | ICD-10-CM | POA: Insufficient documentation

## 2014-08-10 DIAGNOSIS — Y92410 Unspecified street and highway as the place of occurrence of the external cause: Secondary | ICD-10-CM | POA: Insufficient documentation

## 2014-08-10 DIAGNOSIS — S29012A Strain of muscle and tendon of back wall of thorax, initial encounter: Secondary | ICD-10-CM

## 2014-08-10 DIAGNOSIS — Z791 Long term (current) use of non-steroidal anti-inflammatories (NSAID): Secondary | ICD-10-CM | POA: Insufficient documentation

## 2014-08-10 DIAGNOSIS — I10 Essential (primary) hypertension: Secondary | ICD-10-CM | POA: Insufficient documentation

## 2014-08-10 DIAGNOSIS — Z79899 Other long term (current) drug therapy: Secondary | ICD-10-CM | POA: Insufficient documentation

## 2014-08-10 DIAGNOSIS — J45909 Unspecified asthma, uncomplicated: Secondary | ICD-10-CM | POA: Insufficient documentation

## 2014-08-10 MED ORDER — HYDROCODONE-ACETAMINOPHEN 5-325 MG PO TABS: 2.0000 | ORAL_TABLET | ORAL | Status: DC | PRN

## 2014-08-10 MED ORDER — NAPROXEN 500 MG PO TABS: 500.0000 mg | ORAL_TABLET | Freq: Two times a day (BID) | ORAL | Status: DC

## 2014-08-10 MED ORDER — METHOCARBAMOL 500 MG PO TABS: 500.0000 mg | ORAL_TABLET | Freq: Two times a day (BID) | ORAL | Status: DC
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
## 2014-08-10 NOTE — Discharge Instructions (Signed)
Contusion A contusion is a deep bruise. Contusions happen when an injury causes bleeding under the skin. Signs of bruising include pain, puffiness (swelling), and discolored skin. The contusion may turn blue, purple, or yellow. HOME CARE   Put ice on the injured area.  Put ice in a plastic bag.  Place a towel between your skin and the bag.  Leave the ice on for 15-20 minutes, 03-04 times a day.  Only take medicine as told by your doctor.  Rest the injured area.  If possible, raise (elevate) the injured area to lessen puffiness. GET HELP RIGHT AWAY IF:   You have more bruising or puffiness.  You have pain that is getting worse.  Your puffiness or pain is not helped by medicine. MAKE SURE YOU:   Understand these instructions.  Will watch your condition.  Will get help right away if you are not doing well or get worse. Document Released: 05/03/2008 Document Revised: 02/07/2012 Document Reviewed: 09/20/2011 [facility] Patient Information 2015 [place], [place]. This information is not intended to replace advice given to you by your health care provider. Make sure you discuss any questions you have with your health care provider.  Thoracic Strain Thoracic strain is an injury to the muscles of the upper back. A mild strain may take only 1 week to heal. Torn muscles or tendons may take 6 weeks to 2 months to heal. HOME CARE  Put ice on the injured area.  Put ice in a plastic bag.  Place a towel between your skin and the bag.  Leave the ice on for 15-20 minutes, 03-04 times a day, for the first 2 days.  Only take medicine as told by your doctor.  Go to physical therapy and perform exercises as told by your doctor.  Use wraps and back braces as told by your doctor.  Warm up before being active. GET HELP RIGHT AWAY IF:   There is more bruising, puffiness (swelling), or pain.  Medicine does not help the pain.  You have trouble breathing, chest pain, or a fever.  Your  problems seem to be getting worse, not better. MAKE SURE YOU:   Understand these instructions.  Will watch your condition.  Will get help right away if you are not doing well or get worse. Document Released: 05/03/2008 Document Revised: 02/07/2012 Document Reviewed: 01/04/2011 [facility] Patient Information 2015 [place], [place]. This information is not intended to replace advice given to you by your health care provider. Make sure you discuss any questions you have with your health care provider.
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
## 2014-09-24 ENCOUNTER — Emergency Department (HOSPITAL_BASED_OUTPATIENT_CLINIC_OR_DEPARTMENT_OTHER)
Admission: EM | Admit: 2014-09-24 | Discharge: 2014-09-24 | Disposition: A | Discharge status: Home / Self Care | Payer: No Typology Code available for payment source | Attending: Emergency Medicine | Admitting: Emergency Medicine

## 2014-09-24 ENCOUNTER — Encounter (HOSPITAL_BASED_OUTPATIENT_CLINIC_OR_DEPARTMENT_OTHER): Payer: Self-pay | Admitting: Emergency Medicine

## 2014-09-24 ENCOUNTER — Emergency Department (HOSPITAL_BASED_OUTPATIENT_CLINIC_OR_DEPARTMENT_OTHER): Payer: No Typology Code available for payment source

## 2014-09-24 DIAGNOSIS — I10 Essential (primary) hypertension: Secondary | ICD-10-CM | POA: Insufficient documentation

## 2014-09-24 DIAGNOSIS — Z8639 Personal history of other endocrine, nutritional and metabolic disease: Secondary | ICD-10-CM | POA: Insufficient documentation

## 2014-09-24 DIAGNOSIS — J069 Acute upper respiratory infection, unspecified: Secondary | ICD-10-CM | POA: Insufficient documentation

## 2014-09-24 DIAGNOSIS — Z79899 Other long term (current) drug therapy: Secondary | ICD-10-CM | POA: Insufficient documentation

## 2014-09-24 DIAGNOSIS — Z880 Allergy status to penicillin: Secondary | ICD-10-CM | POA: Insufficient documentation

## 2014-09-24 DIAGNOSIS — J45909 Unspecified asthma, uncomplicated: Secondary | ICD-10-CM | POA: Insufficient documentation

## 2014-09-24 DIAGNOSIS — Z87442 Personal history of urinary calculi: Secondary | ICD-10-CM | POA: Insufficient documentation

## 2014-09-24 DIAGNOSIS — G8929 Other chronic pain: Secondary | ICD-10-CM | POA: Insufficient documentation

## 2014-09-24 DIAGNOSIS — Z720 Tobacco use: Secondary | ICD-10-CM | POA: Insufficient documentation

## 2014-09-24 DIAGNOSIS — J40 Bronchitis, not specified as acute or chronic: Secondary | ICD-10-CM

## 2014-09-24 DIAGNOSIS — R059 Cough, unspecified: Secondary | ICD-10-CM

## 2014-09-24 DIAGNOSIS — R05 Cough: Secondary | ICD-10-CM | POA: Insufficient documentation

## 2014-09-24 DIAGNOSIS — Z7952 Long term (current) use of systemic steroids: Secondary | ICD-10-CM | POA: Insufficient documentation

## 2014-09-24 MED ORDER — IPRATROPIUM-ALBUTEROL 0.5-2.5 (3) MG/3ML IN SOLN
3.0000 mL | Freq: Once | RESPIRATORY_TRACT | Status: AC
  Administered 2014-09-24: 3 mL via RESPIRATORY_TRACT
  Filled 2014-09-24: qty 3

## 2014-09-24 MED ORDER — PREDNISONE 20 MG PO TABS: ORAL_TABLET | ORAL | Status: DC

## 2014-09-24 MED ORDER — ALBUTEROL SULFATE HFA 108 (90 BASE) MCG/ACT IN AERS
2.0000 | INHALATION_SPRAY | Freq: Once | RESPIRATORY_TRACT | Status: AC
  Administered 2014-09-24: 2 via RESPIRATORY_TRACT
  Filled 2014-09-24: qty 6.7

## 2014-09-24 MED ORDER — ALBUTEROL SULFATE (2.5 MG/3ML) 0.083% IN NEBU: 5.0000 mg | INHALATION_SOLUTION | Freq: Once | RESPIRATORY_TRACT | Status: DC

## 2014-09-24 MED ORDER — PREDNISONE 50 MG PO TABS
60.0000 mg | ORAL_TABLET | Freq: Once | ORAL | Status: AC
  Administered 2014-09-24: 60 mg via ORAL
  Filled 2014-09-24 (×2): qty 1

## 2014-09-24 MED ORDER — HYDROCOD POLST-CHLORPHEN POLST 10-8 MG/5ML PO LQCR: 5.0000 mL | Freq: Every evening | ORAL | Status: DC | PRN

## 2014-09-24 MED ORDER — ALBUTEROL SULFATE (2.5 MG/3ML) 0.083% IN NEBU
2.5000 mg | INHALATION_SOLUTION | Freq: Once | RESPIRATORY_TRACT | Status: AC
  Administered 2014-09-24: 2.5 mg via RESPIRATORY_TRACT
  Filled 2014-09-24: qty 3

## 2014-09-24 MED ORDER — AZITHROMYCIN 250 MG PO TABS: 250.0000 mg | ORAL_TABLET | Freq: Every day | ORAL | Status: DC

## 2014-09-24 MED ORDER — IPRATROPIUM BROMIDE 0.02 % IN SOLN: 0.5000 mg | Freq: Once | RESPIRATORY_TRACT | Status: DC

## 2014-09-24 NOTE — ED Provider Notes (Signed)
CSN: [id number]     Arrival date & time 09/24/14  1440 History   First MD Initiated Contact with Patient 09/24/14 1454     Chief Complaint  Patient presents with  . URI     (Consider location/radiation/quality/duration/timing/severity/associated sxs/prior Treatment) HPI Comments: This is a 33 year old female with a past medical history of exercise-induced asthma, hypertension, hyperlipidemia and heroin addiction who presents to the emergency department complaining of cough 1 month worsening over the past few days. Cough is productive with yellow mucus. She endorses generalized body aches, chest tightness, wheezing, fever and chills. She checked her temperature this morning which was 101, she took ibuprofen about 2 hours prior to arrival. She has tried over-the-counter Robitussin and Mucinex with no relief. She reports sinus pressure and nasal congestion.  Patient is a 33 y.o. female presenting with URI. The history is provided by the patient.  URI   Past Medical History  Diagnosis Date  . Asthma   . Chronic lower back pain   . Chronic ear infection   . Kidney stones   . Hearing loss in left ear   . Marijuana dependence   . Hypertension   . Hyperlipidemia   . Heroin addiction    Past Surgical History  Procedure Laterality Date  . Dilation and curettage of uterus    . Tubal ligation    . Abdominal hysterectomy     No family history on file. History  Substance Use Topics  . Smoking status: Current Every Day Smoker -- 1.00 packs/day for 15 years    Types: Cigarettes  . Smokeless tobacco: Not on file  . Alcohol Use: Yes     Comment: occasional   OB History   Grav Para Term Preterm Abortions TAB SAB Ect Mult Living                 Review of Systems  10 Systems reviewed and are negative for acute change except as noted in the HPI.  Allergies  Amoxicillin; Eggs or egg-derived products; Penicillins; and Wheat bran  Home Medications   Prior to Admission medications    Medication Sig Start Date End Date Taking? Authorizing Provider  azithromycin (ZITHROMAX) 250 MG tablet Take 1 tablet (250 mg total) by mouth daily. Take first 2 tablets together, then 1 every day until finished. 09/24/14   Kathrynn Speedobyn M Shaddai Shapley, PA-C  chlorpheniramine-HYDROcodone (TUSSIONEX PENNKINETIC ER) 10-8 MG/5ML LQCR Take 5 mLs by mouth at bedtime as needed for cough. 09/24/14   Cedra Villalon M Brigid Vandekamp, PA-C  CLONIDINE HCL PO Take by mouth.    Historical Provider, MD  HYDROcodone-acetaminophen (NORCO/VICODIN) 5-325 MG per tablet Take 2 tablets by mouth every 4 (four) hours as needed. 08/10/14   Rolland PorterMark James, MD  ibuprofen (ADVIL,MOTRIN) 200 MG tablet Take 200 mg by mouth every 6 (six) hours as needed.    Historical Provider, MD  loratadine (CLARITIN) 10 MG tablet Take 10 mg by mouth daily.    Historical Provider, MD  methocarbamol (ROBAXIN) 500 MG tablet Take 1 tablet (500 mg total) by mouth 2 (two) times daily. 08/10/14   Rolland PorterMark James, MD  naproxen (NAPROSYN) 500 MG tablet Take 1 tablet (500 mg total) by mouth 2 (two) times daily. 08/10/14   Rolland PorterMark James, MD  naproxen sodium (ANAPROX DS) 550 MG tablet Take 1 tablet every 12 hours until stone passes. Best taken with a meal. 03/14/14   Carlisle BeersJohn L Molpus, MD  ondansetron (ZOFRAN ODT) 8 MG disintegrating tablet Take 1 tablet (8 mg total)  by mouth every 8 (eight) hours as needed. 03/14/14   Carlisle BeersJohn L Molpus, MD  predniSONE (DELTASONE) 20 MG tablet 2 tabs po daily x 4 days 09/24/14   Kathrynn Speedobyn M Raiden Haydu, PA-C  tamsulosin (FLOMAX) 0.4 MG CAPS capsule Take 1 capsule daily until stone passes. 03/14/14   John L Molpus, MD   BP 125/75  Pulse 86  Temp(Src) 98 F (36.7 C) (Oral)  Resp 20  Ht 5\' 6"  (1.676 m)  Wt 160 lb (72.576 kg)  BMI 25.84 kg/m2  SpO2 96%  LMP 03/21/2012 Physical Exam  Nursing note and vitals reviewed. Constitutional: She is oriented to person, place, and time. She appears well-developed and well-nourished. No distress.  HENT:  Head: Normocephalic and atraumatic.   Post oropharyngeal erythema without edema or exudate. Post nasal drip. Nasal congestion, mucosal edema.  Eyes: Conjunctivae are normal.  Neck: Normal range of motion. Neck supple.  Cardiovascular: Normal rate, regular rhythm and normal heart sounds.   Pulmonary/Chest: Effort normal.  Diffuse wheezes/rhonchi bilateral. Harsh cough present.  Musculoskeletal: Normal range of motion. She exhibits no edema.  Lymphadenopathy:    She has no cervical adenopathy.  Neurological: She is alert and oriented to person, place, and time.  Skin: Skin is warm and dry. She is not diaphoretic.  Psychiatric: She has a normal mood and affect. Her behavior is normal.    ED Course  Procedures (including critical care time) Labs Review Labs Reviewed - No data to display  Imaging Review Dg Chest 2 View  09/24/2014   CLINICAL DATA:  Cough.  Fever.  Chills.  EXAM: CHEST  2 VIEW  COMPARISON:  None.  FINDINGS: Heart size and pulmonary vascularity are normal. There is slight peribronchial thickening consistent with bronchitis. No consolidative infiltrates or effusions. No osseous abnormality.  IMPRESSION: Bronchitic changes.   Electronically Signed   By: Geanie CooleyJim  Maxwell M.D.   On: 09/24/2014 15:52     EKG Interpretation   Date/Time:  Tuesday September 24 2014 14:55:35 EDT Ventricular Rate:  82 PR Interval:  150 QRS Duration: 90 QT Interval:  390 QTC Calculation: 455 R Axis:   60 Text Interpretation:  Normal sinus rhythm with sinus arrhythmia RSR' or QR  pattern in V1 suggests right ventricular conduction delay Cannot rule out  Anterior infarct , age undetermined no previous for comparison Confirmed  by HARRISON  MD, FORREST ([id number]) on 09/24/2014 3:22:10 PM      MDM   Final diagnoses:  Cough  Bronchitis   Patient nontoxic appearing and in no apparent distress. Afebrile, vital signs stable. O2 sat 97% on room air. After receiving DuoNeb, patient's breath sounds significantly improved, however wheezing  and rhonchi still noted. Chest x-ray showing bronchitic change, no other acute finding. Given patient's cough has been present for 1 month, will treat for bronchitis. She will be discharged with prednisone, azithromycin, albuterol inhaler and cough medication. Follow-up with PCP. Strongly encouraged smoking cessation. Stable for discharge. Return precautions given. Patient states understanding of treatment care plan and is agreeable.  Kathrynn SpeedRobyn M Mareta Chesnut, PA-C 09/24/14 1610

## 2014-09-24 NOTE — ED Notes (Addendum)
Chills, cough, aching all over, and fever. States she feels like an elephant is sitting on her chest.

## 2014-09-24 NOTE — Discharge Instructions (Signed)
Use albuterol inhaler every 4-6 hours as needed for cough and wheezing. Take Tussionex as prescribed for severe cough, no driving or operating heavy machinery while taking Tussionex as it may cause drowsiness. Take prednisone beginning tomorrow as you were given the first dose in the emergency department today. Taken azithromycin to completion.   Cough, Adult  A cough is a reflex that helps clear your throat and airways. It can help heal the body or may be a reaction to an irritated airway. A cough may only last 2 or 3 weeks (acute) or may last more than 8 weeks (chronic).  CAUSES Acute cough:  Viral or bacterial infections. Chronic cough:  Infections.  Allergies.  Asthma.  Post-nasal drip.  Smoking.  Heartburn or acid reflux.  Some medicines.  Chronic lung problems (COPD).  Cancer. SYMPTOMS   Cough.  Fever.  Chest pain.  Increased breathing rate.  High-pitched whistling sound when breathing (wheezing).  Colored mucus that you cough up (sputum). TREATMENT   A bacterial cough may be treated with antibiotic medicine.  A viral cough must run its course and will not respond to antibiotics.  Your caregiver may recommend other treatments if you have a chronic cough. HOME CARE INSTRUCTIONS   Only take over-the-counter or prescription medicines for pain, discomfort, or fever as directed by your caregiver. Use cough suppressants only as directed by your caregiver.  Use a cold steam vaporizer or humidifier in your bedroom or home to help loosen secretions.  Sleep in a semi-upright position if your cough is worse at night.  Rest as needed.  Stop smoking if you smoke. SEEK IMMEDIATE MEDICAL CARE IF:   You have pus in your sputum.  Your cough starts to worsen.  You cannot control your cough with suppressants and are losing sleep.  You begin coughing up blood.  You have difficulty breathing.  You develop pain which is getting worse or is uncontrolled with  medicine.  You have a fever. MAKE SURE YOU:   Understand these instructions.  Will watch your condition.  Will get help right away if you are not doing well or get worse. Document Released: 05/14/2011 Document Revised: 02/07/2012 Document Reviewed: 05/14/2011 [facility] Patient Information 2015 [place], [place]. This information is not intended to replace advice given to you by your health care provider. Make sure you discuss any questions you have with your health care provider. Acute Bronchitis Bronchitis is inflammation of the airways that extend from the windpipe into the lungs (bronchi). The inflammation often causes mucus to develop. This leads to a cough, which is the most common symptom of bronchitis.  In acute bronchitis, the condition usually develops suddenly and goes away over time, usually in a couple weeks. Smoking, allergies, and asthma can make bronchitis worse. Repeated episodes of bronchitis may cause further lung problems.  CAUSES Acute bronchitis is most often caused by the same virus that causes a cold. The virus can spread from person to person (contagious) through coughing, sneezing, and touching contaminated objects. SIGNS AND SYMPTOMS   Cough.   Fever.   Coughing up mucus.   Body aches.   Chest congestion.   Chills.   Shortness of breath.   Sore throat.  DIAGNOSIS  Acute bronchitis is usually diagnosed through a physical exam. Your health care provider will also ask you questions about your medical history. Tests, such as chest X-rays, are sometimes done to rule out other conditions.  TREATMENT  Acute bronchitis usually goes away in a couple weeks. Oftentimes,  no medical treatment is necessary. Medicines are sometimes given for relief of fever or cough. Antibiotic medicines are usually not needed but may be prescribed in certain situations. In some cases, an inhaler may be recommended to help reduce shortness of breath and control the cough. A cool  mist vaporizer may also be used to help thin bronchial secretions and make it easier to clear the chest.  HOME CARE INSTRUCTIONS  Get plenty of rest.   Drink enough fluids to keep your urine clear or pale yellow (unless you have a medical condition that requires fluid restriction). Increasing fluids may help thin your respiratory secretions (sputum) and reduce chest congestion, and it will prevent dehydration.   Take medicines only as directed by your health care provider.  If you were prescribed an antibiotic medicine, finish it all even if you start to feel better.  Avoid smoking and secondhand smoke. Exposure to cigarette smoke or irritating chemicals will make bronchitis worse. If you are a smoker, consider using nicotine gum or skin patches to help control withdrawal symptoms. Quitting smoking will help your lungs heal faster.   Reduce the chances of another bout of acute bronchitis by washing your hands frequently, avoiding people with cold symptoms, and trying not to touch your hands to your mouth, nose, or eyes.   Keep all follow-up visits as directed by your health care provider.  SEEK MEDICAL CARE IF: Your symptoms do not improve after 1 week of treatment.  SEEK IMMEDIATE MEDICAL CARE IF:  You develop an increased fever or chills.   You have chest pain.   You have severe shortness of breath.  You have bloody sputum.   You develop dehydration.  You faint or repeatedly feel like you are going to pass out.  You develop repeated vomiting.  You develop a severe headache. MAKE SURE YOU:   Understand these instructions.  Will watch your condition.  Will get help right away if you are not doing well or get worse. Document Released: 12/23/2004 Document Revised: 04/01/2014 Document Reviewed: 05/08/2013 [facility] Patient Information 2015 [place], [place]. This information is not intended to replace advice given to you by your health care provider. Make sure you discuss  any questions you have with your health care provider.

## 2014-09-25 NOTE — ED Provider Notes (Signed)
Medical screening examination/treatment/procedure(s) were performed by non-physician practitioner and as supervising physician I was immediately available for consultation/collaboration.   EKG Interpretation   Date/Time:  Tuesday September 24 2014 14:55:35 EDT Ventricular Rate:  82 PR Interval:  150 QRS Duration: 90 QT Interval:  390 QTC Calculation: 455 R Axis:   60 Text Interpretation:  Normal sinus rhythm with sinus arrhythmia RSR' or QR  pattern in V1 suggests right ventricular conduction delay Cannot rule out  Anterior infarct , age undetermined no previous for comparison Confirmed  by HARRISON  MD, FORREST [phone]([id number]) on 09/24/2014 3:22:10 PM       Vanetta MuldersScott Natania Finigan, MD 09/25/14 [phone]

## 2014-12-06 IMAGING — CR DG CHEST 2V
2 series · 2 of 2 positions shown · non-contrast
Comparison: None.

CLINICAL DATA: Cough.  Fever.  Chills.

EXAM:
CHEST  2 VIEW

[w chest pa]
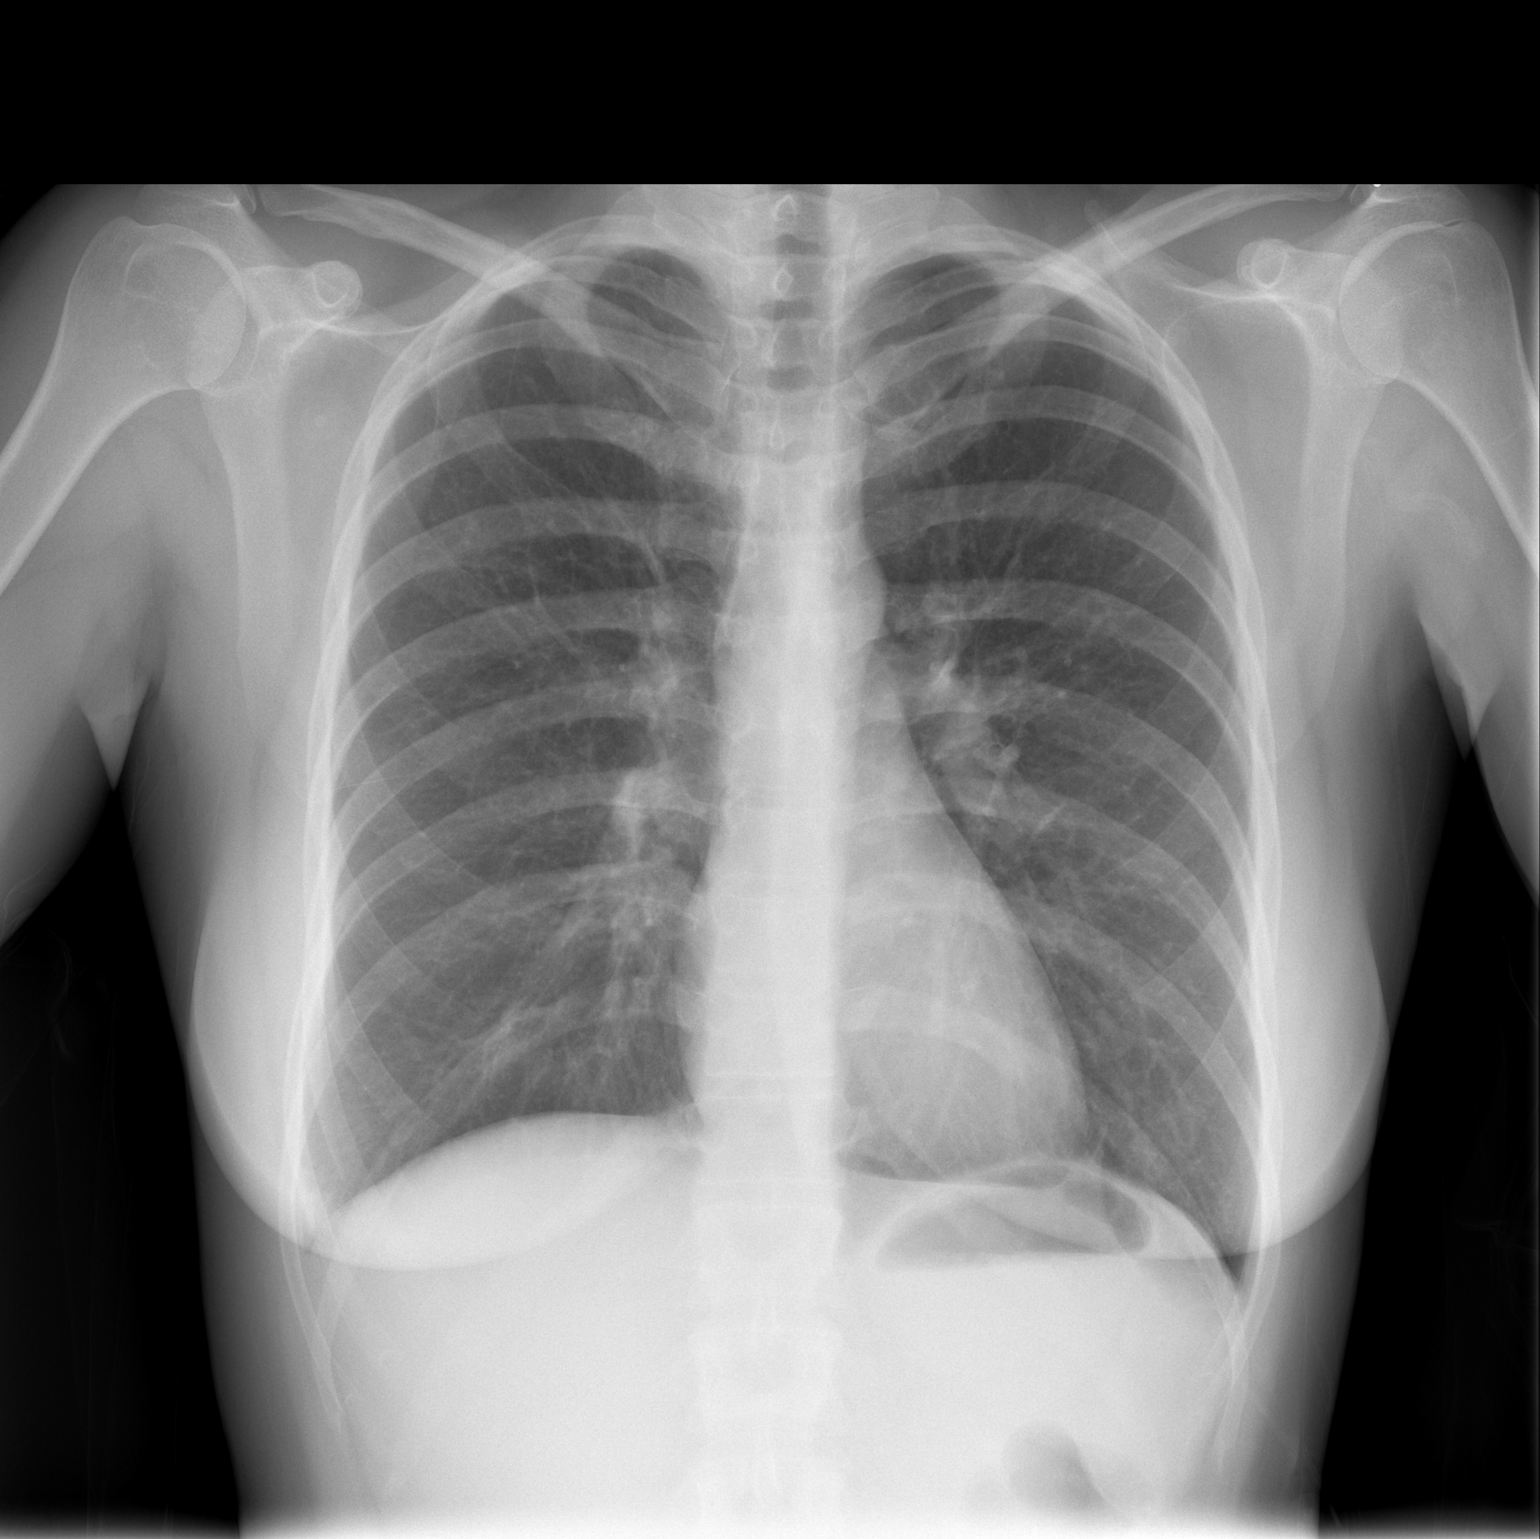

[w chest lat]
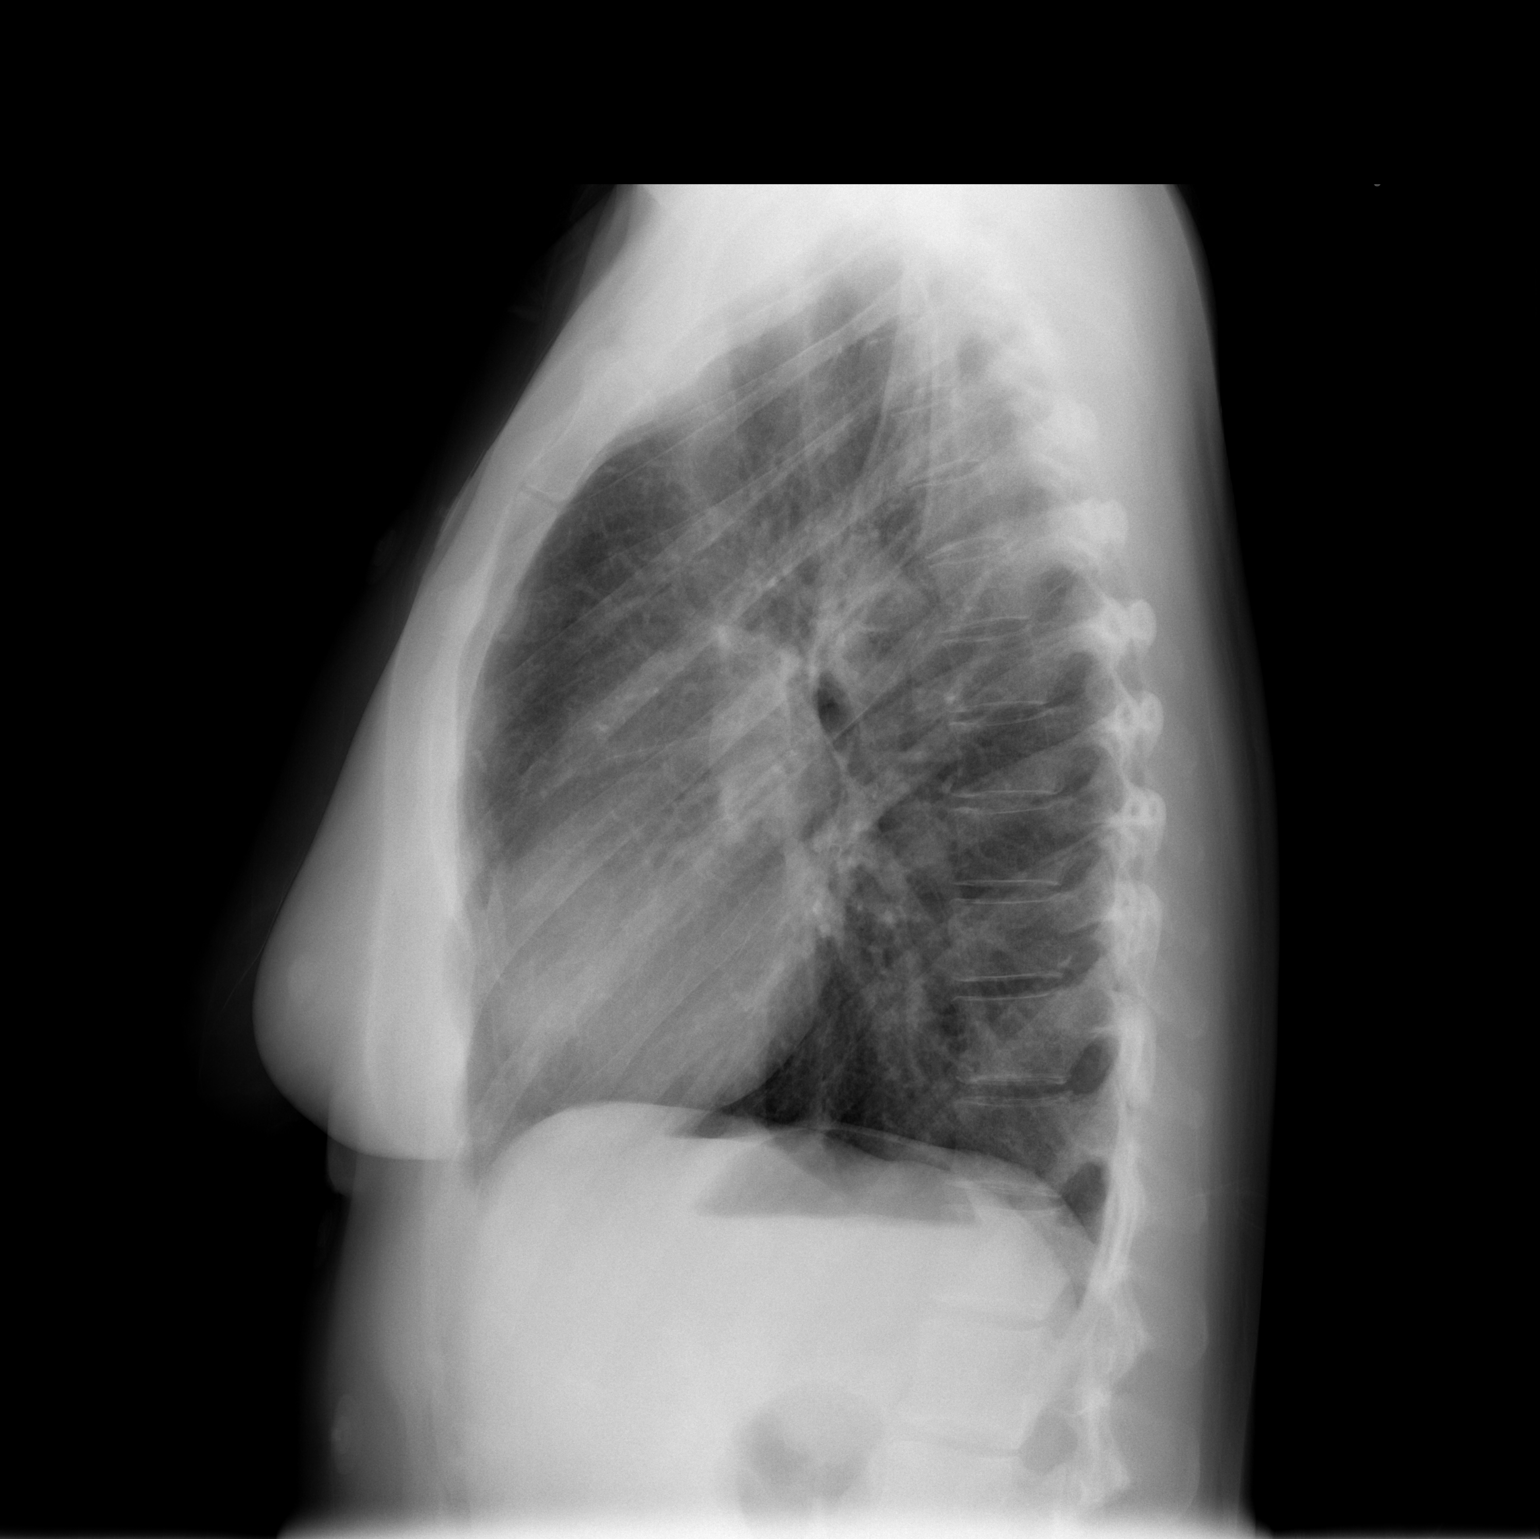

[2 of 2 positions shown; findings below may reference images not displayed]

FINDINGS: Heart size and pulmonary vascularity are normal. There is slight
peribronchial thickening consistent with bronchitis. No
consolidative infiltrates or effusions. No osseous abnormality.
IMPRESSION: Bronchitic changes.

## 2015-03-20 ENCOUNTER — Emergency Department (HOSPITAL_BASED_OUTPATIENT_CLINIC_OR_DEPARTMENT_OTHER)
Admission: EM | Admit: 2015-03-20 | Discharge: 2015-03-20 | Disposition: A | Discharge status: Home / Self Care | Payer: Medicaid Other | Attending: Emergency Medicine | Admitting: Emergency Medicine

## 2015-03-20 ENCOUNTER — Emergency Department (HOSPITAL_BASED_OUTPATIENT_CLINIC_OR_DEPARTMENT_OTHER): Payer: Medicaid Other

## 2015-03-20 ENCOUNTER — Encounter (HOSPITAL_BASED_OUTPATIENT_CLINIC_OR_DEPARTMENT_OTHER): Payer: Self-pay | Admitting: *Deleted

## 2015-03-20 DIAGNOSIS — Z87448 Personal history of other diseases of urinary system: Secondary | ICD-10-CM | POA: Insufficient documentation

## 2015-03-20 DIAGNOSIS — Z791 Long term (current) use of non-steroidal anti-inflammatories (NSAID): Secondary | ICD-10-CM | POA: Insufficient documentation

## 2015-03-20 DIAGNOSIS — H9192 Unspecified hearing loss, left ear: Secondary | ICD-10-CM | POA: Insufficient documentation

## 2015-03-20 DIAGNOSIS — I10 Essential (primary) hypertension: Secondary | ICD-10-CM | POA: Insufficient documentation

## 2015-03-20 DIAGNOSIS — N390 Urinary tract infection, site not specified: Secondary | ICD-10-CM

## 2015-03-20 DIAGNOSIS — Z880 Allergy status to penicillin: Secondary | ICD-10-CM | POA: Insufficient documentation

## 2015-03-20 DIAGNOSIS — Z720 Tobacco use: Secondary | ICD-10-CM | POA: Insufficient documentation

## 2015-03-20 DIAGNOSIS — Z792 Long term (current) use of antibiotics: Secondary | ICD-10-CM | POA: Insufficient documentation

## 2015-03-20 DIAGNOSIS — M549 Dorsalgia, unspecified: Secondary | ICD-10-CM

## 2015-03-20 DIAGNOSIS — J45909 Unspecified asthma, uncomplicated: Secondary | ICD-10-CM | POA: Insufficient documentation

## 2015-03-20 DIAGNOSIS — G8929 Other chronic pain: Secondary | ICD-10-CM | POA: Insufficient documentation

## 2015-03-20 DIAGNOSIS — Z79899 Other long term (current) drug therapy: Secondary | ICD-10-CM | POA: Insufficient documentation

## 2015-03-20 DIAGNOSIS — Z8639 Personal history of other endocrine, nutritional and metabolic disease: Secondary | ICD-10-CM | POA: Insufficient documentation

## 2015-03-20 DIAGNOSIS — M545 Low back pain: Secondary | ICD-10-CM | POA: Insufficient documentation

## 2015-03-20 LAB — CBC
HEMATOCRIT: 42.4 % (ref 36.0–46.0)
HEMOGLOBIN: 14 g/dL (ref 12.0–15.0)
MCH: 30 pg (ref 26.0–34.0)
MCHC: 33 g/dL (ref 30.0–36.0)
MCV: 90.8 fL (ref 78.0–100.0)
PLATELETS: 232 10*3/uL (ref 150–400)
RBC: 4.67 MIL/uL (ref 3.87–5.11)
RDW: 13.4 % (ref 11.5–15.5)
WBC: 9.9 10*3/uL (ref 4.0–10.5)

## 2015-03-20 LAB — COMPREHENSIVE METABOLIC PANEL
ALK PHOS: 59 U/L (ref 39–117)
ALT: 67 U/L — ABNORMAL HIGH (ref 0–35)
AST: 52 U/L — ABNORMAL HIGH (ref 0–37)
Albumin: 3.7 g/dL (ref 3.5–5.2)
Anion gap: 8 (ref 5–15)
BILIRUBIN TOTAL: 1.3 mg/dL — AB (ref 0.3–1.2)
BUN: 8 mg/dL (ref 6–23)
CO2: 28 mmol/L (ref 19–32)
Calcium: 8.6 mg/dL (ref 8.4–10.5)
Chloride: 100 mmol/L (ref 96–112)
Creatinine, Ser: 0.62 mg/dL (ref 0.50–1.10)
GFR calc non Af Amer: >90 mL/min (ref >90)
GLUCOSE: 111 mg/dL — AB (ref 70–99)
Potassium: 4.5 mmol/L (ref 3.5–5.1)
Sodium: 136 mmol/L (ref 135–145)
TOTAL PROTEIN: 7.4 g/dL (ref 6.0–8.3)

## 2015-03-20 LAB — URINALYSIS, ROUTINE W REFLEX MICROSCOPIC
Bilirubin Urine: NEGATIVE
Glucose, UA: NEGATIVE mg/dL
Hgb urine dipstick: NEGATIVE
Ketones, ur: NEGATIVE mg/dL
Nitrite: NEGATIVE
PH: 6.5 (ref 5.0–8.0)
Protein, ur: NEGATIVE mg/dL
Specific Gravity, Urine: 1.021 (ref 1.005–1.030)
Urobilinogen, UA: 2 mg/dL — ABNORMAL HIGH (ref 0.0–1.0)

## 2015-03-20 LAB — URINE MICROSCOPIC-ADD ON

## 2015-03-20 MED ORDER — ONDANSETRON HCL 4 MG/2ML IJ SOLN
4.0000 mg | Freq: Once | INTRAMUSCULAR | Status: AC
  Administered 2015-03-20: 4 mg via INTRAVENOUS
  Filled 2015-03-20: qty 2

## 2015-03-20 MED ORDER — KETOROLAC TROMETHAMINE 30 MG/ML IJ SOLN
30.0000 mg | Freq: Once | INTRAMUSCULAR | Status: AC
  Administered 2015-03-20: 30 mg via INTRAVENOUS
  Filled 2015-03-20: qty 1

## 2015-03-20 MED ORDER — ONDANSETRON 4 MG PO TBDP: ORAL_TABLET | ORAL | Status: DC

## 2015-03-20 MED ORDER — CIPROFLOXACIN HCL 500 MG PO TABS: 500.0000 mg | ORAL_TABLET | Freq: Two times a day (BID) | ORAL | Status: DC

## 2015-03-20 MED ORDER — CIPROFLOXACIN HCL 500 MG PO TABS
500.0000 mg | ORAL_TABLET | Freq: Once | ORAL | Status: AC
  Administered 2015-03-20: 500 mg via ORAL
  Filled 2015-03-20: qty 1

## 2015-03-20 MED ORDER — IPRATROPIUM-ALBUTEROL 0.5-2.5 (3) MG/3ML IN SOLN
3.0000 mL | Freq: Once | RESPIRATORY_TRACT | Status: AC
  Administered 2015-03-20: 3 mL via RESPIRATORY_TRACT
  Filled 2015-03-20: qty 3

## 2015-03-20 NOTE — ED Provider Notes (Signed)
CSN: [id number]     Arrival date & time 03/20/15  1736 History  This chart was scribed for Elwin Mocha, MD by Gwenyth Ober, ED Scribe. This patient was seen in room MH07/MH07 and the patient's care was started at 6:20 PM.    Chief Complaint  Patient presents with  . Back Pain   Patient is a 34 y.o. female presenting with back pain. The history is provided by the patient. No language interpreter was used.  Back Pain Location:  Lumbar spine Quality:  Unable to specify Radiates to:  Does not radiate Pain severity:  Moderate Pain is:  Same all the time Onset quality:  Gradual Duration:  1 day Timing:  Constant Progression:  Unchanged Chronicity:  New Relieved by:  Nothing Worsened by:  Nothing tried Ineffective treatments:  Ibuprofen Associated symptoms: fever   Associated symptoms: no abdominal pain, no chest pain and no dysuria     HPI Comments: Jennifer Ballard is a 34 y.o. female with a history of asthma, chronic lower back pain, kidney stones, substance abuse and HTN, who presents to the Emergency Department complaining of constant, moderate lower back pain that started earlier today. Pt states subjective fever that occurred last night, nausea, 1 episode of vomiting and urinary urgency as associated symptoms. She also notes that she has recently been diagnosed with bronchitis. Pt denies alleviating or exacerbating factors. She has tried increasing fluid intake and Ibuprofen with minimal relief. Pt has a history of kidney stones, but states her current symptoms are different than prior episodes. She denies hematemesis, diarrhea, SOB, abdominal pain, dysuria and vaginal discharge as associated symptoms.   Past Medical History  Diagnosis Date  . Asthma   . Chronic lower back pain   . Chronic ear infection   . Kidney stones   . Hearing loss in left ear   . Marijuana dependence   . Hypertension   . Hyperlipidemia   . Heroin addiction    Past Surgical History  Procedure  Laterality Date  . Dilation and curettage of uterus    . Tubal ligation    . Abdominal hysterectomy     No family history on file. History  Substance Use Topics  . Smoking status: Current Every Day Smoker -- 1.00 packs/day for 15 years    Types: Cigarettes  . Smokeless tobacco: Not on file  . Alcohol Use: Yes     Comment: occasional   OB History    No data available     Review of Systems  Constitutional: Positive for fever.  Respiratory: Negative for shortness of breath.   Cardiovascular: Negative for chest pain.  Gastrointestinal: Positive for nausea and vomiting. Negative for abdominal pain and diarrhea.  Genitourinary: Positive for urgency. Negative for dysuria and vaginal discharge.  Musculoskeletal: Positive for back pain.  All other systems reviewed and are negative.   Allergies  Amoxicillin; Eggs or egg-derived products; Penicillins; and Wheat bran  Home Medications   Prior to Admission medications   Medication Sig Start Date End Date Taking? Authorizing Provider  azithromycin (ZITHROMAX) 250 MG tablet Take 1 tablet (250 mg total) by mouth daily. Take first 2 tablets together, then 1 every day until finished. 09/24/14   Kathrynn Speed, PA-C  chlorpheniramine-HYDROcodone (TUSSIONEX PENNKINETIC ER) 10-8 MG/5ML LQCR Take 5 mLs by mouth at bedtime as needed for cough. 09/24/14   Robyn M Hess, PA-C  CLONIDINE HCL PO Take by mouth.    Historical Provider, MD  HYDROcodone-acetaminophen (NORCO/VICODIN) 5-325 MG  per tablet Take 2 tablets by mouth every 4 (four) hours as needed. 08/10/14   Rolland PorterMark James, MD  ibuprofen (ADVIL,MOTRIN) 200 MG tablet Take 200 mg by mouth every 6 (six) hours as needed.    Historical Provider, MD  loratadine (CLARITIN) 10 MG tablet Take 10 mg by mouth daily.    Historical Provider, MD  methocarbamol (ROBAXIN) 500 MG tablet Take 1 tablet (500 mg total) by mouth 2 (two) times daily. 08/10/14   Rolland PorterMark James, MD  naproxen (NAPROSYN) 500 MG tablet Take 1 tablet  (500 mg total) by mouth 2 (two) times daily. 08/10/14   Rolland PorterMark James, MD  naproxen sodium (ANAPROX DS) 550 MG tablet Take 1 tablet every 12 hours until stone passes. Best taken with a meal. 03/14/14   John Molpus, MD  ondansetron (ZOFRAN ODT) 8 MG disintegrating tablet Take 1 tablet (8 mg total) by mouth every 8 (eight) hours as needed. 03/14/14   John Molpus, MD  predniSONE (DELTASONE) 20 MG tablet 2 tabs po daily x 4 days 09/24/14   Kathrynn Speedobyn M Hess, PA-C  tamsulosin (FLOMAX) 0.4 MG CAPS capsule Take 1 capsule daily until stone passes. 03/14/14   John Molpus, MD   BP 117/74 mmHg  Pulse 112  Temp(Src) 99.8 F (37.7 C) (Oral)  Resp 20  Ht 5\' 6"  (1.676 m)  Wt 145 lb (65.772 kg)  BMI 23.41 kg/m2  SpO2 94%  LMP 03/21/2012 Physical Exam  Constitutional: She is oriented to person, place, and time. She appears well-developed and well-nourished. No distress.  HENT:  Head: Normocephalic and atraumatic.  Mouth/Throat: Oropharynx is clear and moist.  Eyes: EOM are normal. Pupils are equal, round, and reactive to light.  Neck: Normal range of motion. Neck supple.  Cardiovascular: Normal rate and regular rhythm.  Exam reveals no friction rub.   No murmur heard. Pulmonary/Chest: Effort normal and breath sounds normal. No respiratory distress. She has no wheezes. She has no rales.  Abdominal: Soft. She exhibits no distension. There is no tenderness. There is no rebound.  No CVA tenderness  Musculoskeletal: Normal range of motion. She exhibits no edema.  Neurological: She is alert and oriented to person, place, and time.  Skin: No rash noted. She is not diaphoretic.  Nursing note and vitals reviewed.   ED Course  Procedures   DIAGNOSTIC STUDIES: Oxygen Saturation is 92% on RA, adequate by my interpretation.    COORDINATION OF CARE: 6:24 PM Discussed UA results and treatment plan with pt which includes CT Renal Study. She agreed to plan.   Labs Review Labs Reviewed  URINALYSIS, ROUTINE W REFLEX  MICROSCOPIC - Abnormal; Notable for the following:    Urobilinogen, UA 2.0 (*)    Leukocytes, UA SMALL (*)    All other components within normal limits  URINE MICROSCOPIC-ADD ON    Imaging Review No results found.   EKG Interpretation None      MDM   Final diagnoses:  Back pain  UTI (lower urinary tract infection)    34-year-old female here with back pain, bilateral, and urinary urgency. No dysuria. No fever documented but is having some tactile fevers. History of kidney stones. Here temp of 99.8, mild tachycardia at 112. She has no appreciable CVA tenderness, lower abdominal pain. Urine has small leuks 36 whites. CT negative for kidney stone that is obstructive. Labs are otherwise normal. Will treat with Cipro since this could be early pyelonephritis.  I personally performed the services described in this documentation, which was scribed  in my presence. The recorded information has been reviewed and is accurate.     Elwin Mocha, MD 03/20/15 1929

## 2015-03-20 NOTE — Discharge Instructions (Signed)

## 2015-03-20 NOTE — ED Notes (Signed)
Back pain and vomiting x 4 hours. Dysuria.

## 2015-08-23 ENCOUNTER — Emergency Department (HOSPITAL_BASED_OUTPATIENT_CLINIC_OR_DEPARTMENT_OTHER)
Admission: EM | Admit: 2015-08-23 | Discharge: 2015-08-23 | Discharge status: Against Medical Advice | Payer: Medicaid Other | Attending: Emergency Medicine | Admitting: Emergency Medicine

## 2015-08-23 ENCOUNTER — Encounter (HOSPITAL_BASED_OUTPATIENT_CLINIC_OR_DEPARTMENT_OTHER): Payer: Self-pay | Admitting: Emergency Medicine

## 2015-08-23 DIAGNOSIS — Z720 Tobacco use: Secondary | ICD-10-CM | POA: Insufficient documentation

## 2015-08-23 DIAGNOSIS — Y9289 Other specified places as the place of occurrence of the external cause: Secondary | ICD-10-CM | POA: Insufficient documentation

## 2015-08-23 DIAGNOSIS — G8929 Other chronic pain: Secondary | ICD-10-CM | POA: Insufficient documentation

## 2015-08-23 DIAGNOSIS — X58XXXA Exposure to other specified factors, initial encounter: Secondary | ICD-10-CM | POA: Insufficient documentation

## 2015-08-23 DIAGNOSIS — T403X1A Poisoning by methadone, accidental (unintentional), initial encounter: Secondary | ICD-10-CM | POA: Insufficient documentation

## 2015-08-23 DIAGNOSIS — I10 Essential (primary) hypertension: Secondary | ICD-10-CM | POA: Insufficient documentation

## 2015-08-23 DIAGNOSIS — Y9389 Activity, other specified: Secondary | ICD-10-CM | POA: Insufficient documentation

## 2015-08-23 DIAGNOSIS — Y998 Other external cause status: Secondary | ICD-10-CM | POA: Insufficient documentation

## 2015-08-23 DIAGNOSIS — J45909 Unspecified asthma, uncomplicated: Secondary | ICD-10-CM | POA: Insufficient documentation

## 2015-08-23 NOTE — ED Notes (Signed)
Patient states that she takes methadone and drank some grapefruit juice and thinks she is havening an interaction.

## 2015-08-23 NOTE — ED Notes (Signed)
Patient told registration that she no longer wanted to wait

## 2015-09-13 ENCOUNTER — Encounter (HOSPITAL_BASED_OUTPATIENT_CLINIC_OR_DEPARTMENT_OTHER): Payer: Self-pay

## 2015-09-13 ENCOUNTER — Emergency Department (HOSPITAL_BASED_OUTPATIENT_CLINIC_OR_DEPARTMENT_OTHER)
Admission: EM | Admit: 2015-09-13 | Discharge: 2015-09-13 | Disposition: A | Discharge status: Home / Self Care | Payer: Medicaid Other | Attending: Emergency Medicine | Admitting: Emergency Medicine

## 2015-09-13 DIAGNOSIS — Y929 Unspecified place or not applicable: Secondary | ICD-10-CM | POA: Insufficient documentation

## 2015-09-13 DIAGNOSIS — I10 Essential (primary) hypertension: Secondary | ICD-10-CM | POA: Insufficient documentation

## 2015-09-13 DIAGNOSIS — T148 Other injury of unspecified body region: Secondary | ICD-10-CM | POA: Insufficient documentation

## 2015-09-13 DIAGNOSIS — Z880 Allergy status to penicillin: Secondary | ICD-10-CM | POA: Insufficient documentation

## 2015-09-13 DIAGNOSIS — Y939 Activity, unspecified: Secondary | ICD-10-CM | POA: Insufficient documentation

## 2015-09-13 DIAGNOSIS — Z720 Tobacco use: Secondary | ICD-10-CM | POA: Insufficient documentation

## 2015-09-13 DIAGNOSIS — Z79899 Other long term (current) drug therapy: Secondary | ICD-10-CM | POA: Insufficient documentation

## 2015-09-13 DIAGNOSIS — G8929 Other chronic pain: Secondary | ICD-10-CM | POA: Insufficient documentation

## 2015-09-13 DIAGNOSIS — Z8639 Personal history of other endocrine, nutritional and metabolic disease: Secondary | ICD-10-CM | POA: Insufficient documentation

## 2015-09-13 DIAGNOSIS — Y999 Unspecified external cause status: Secondary | ICD-10-CM | POA: Insufficient documentation

## 2015-09-13 DIAGNOSIS — Z87442 Personal history of urinary calculi: Secondary | ICD-10-CM | POA: Insufficient documentation

## 2015-09-13 DIAGNOSIS — H9102 Ototoxic hearing loss, left ear: Secondary | ICD-10-CM | POA: Insufficient documentation

## 2015-09-13 DIAGNOSIS — J45909 Unspecified asthma, uncomplicated: Secondary | ICD-10-CM | POA: Insufficient documentation

## 2015-09-13 DIAGNOSIS — T148XXA Other injury of unspecified body region, initial encounter: Secondary | ICD-10-CM

## 2015-09-13 DIAGNOSIS — X58XXXA Exposure to other specified factors, initial encounter: Secondary | ICD-10-CM | POA: Insufficient documentation

## 2015-09-13 MED ORDER — NAPROXEN 500 MG PO TABS: 500.0000 mg | ORAL_TABLET | Freq: Two times a day (BID) | ORAL | Status: DC

## 2015-09-13 NOTE — ED Notes (Signed)
Pt reports painful burning sensation to right thigh x2 days. Denies known injury.

## 2015-09-13 NOTE — ED Provider Notes (Signed)
CSN: [id number]     Arrival date & time 09/13/15  1712 History  By signing my name below, I, Jennifer Ballard, attest that this documentation has been prepared under the direction and in the presence of Rolan BuccoMelanie Amery Vandenbos, MD. Electronically Signed: Doreatha MartinEva Ballard, ED Scribe. 09/13/2015. 6:07 PM.    Chief Complaint  Patient presents with  . Leg Pain   The history is provided by the patient. No language interpreter was used.    HPI Comments: Jennifer Ballard is a 34 y.o. female who presents to the Emergency Department complaining of moderate right thigh pain and burning  onset 2-3 days ago. She states associated mild leg swelling on the right. No known injury or trauma. She notes that she does heavy lifting at work 6 days a week but notes that she does not recall any acute injury or sudden onset pain. She states that pain is worsened when she stands and with ambulation. No Hx of similar symptoms. She denies numbness, tinging, back pain or hip pain.  Past Medical History  Diagnosis Date  . Asthma   . Chronic lower back pain   . Chronic ear infection   . Kidney stones   . Hearing loss in left ear   . Marijuana dependence (HCC)   . Hypertension   . Hyperlipidemia   . Heroin addiction [facility] [address](HCC)    Past Surgical History  Procedure Laterality Date  . Dilation and curettage of uterus    . Tubal ligation    . Abdominal hysterectomy     History reviewed. No pertinent family history. Social History  Substance Use Topics  . Smoking status: Current Every Day Smoker -- 1.00 packs/day for 15 years    Types: Cigarettes  . Smokeless tobacco: None  . Alcohol Use: Yes     Comment: occasional   OB History    No data available     Review of Systems  Constitutional: Negative for chills and diaphoresis.  HENT: Negative for congestion, rhinorrhea and sneezing.   Eyes: Negative.   Respiratory: Negative for cough, chest tightness and shortness of breath.   Cardiovascular: Positive for leg swelling. Negative for  chest pain.  Gastrointestinal: Negative for nausea, vomiting, abdominal pain, diarrhea and blood in stool.  Genitourinary: Negative for frequency, hematuria, flank pain and difficulty urinating.  Musculoskeletal: Positive for arthralgias. Negative for back pain and gait problem.  Skin: Negative for rash.  Neurological: Negative for dizziness, speech difficulty, weakness, numbness and headaches.   Allergies  Amoxicillin; Eggs or egg-derived products; Penicillins; and Wheat bran  Home Medications   Prior to Admission medications   Medication Sig Start Date End Date Taking? Authorizing Provider  CLONIDINE HCL PO Take by mouth.   Yes Historical Provider, MD  ibuprofen (ADVIL,MOTRIN) 200 MG tablet Take 200 mg by mouth every 6 (six) hours as needed.   Yes Historical Provider, MD  loratadine (CLARITIN) 10 MG tablet Take 10 mg by mouth daily.   Yes Historical Provider, MD  azithromycin (ZITHROMAX) 250 MG tablet Take 1 tablet (250 mg total) by mouth daily. Take first 2 tablets together, then 1 every day until finished. 09/24/14   Kathrynn Speedobyn M Hess, PA-C  chlorpheniramine-HYDROcodone (TUSSIONEX PENNKINETIC ER) 10-8 MG/5ML LQCR Take 5 mLs by mouth at bedtime as needed for cough. 09/24/14   Robyn M Hess, PA-C  ciprofloxacin (CIPRO) 500 MG tablet Take 1 tablet (500 mg total) by mouth 2 (two) times daily. 03/20/15   Elwin MochaBlair Walden, MD  HYDROcodone-acetaminophen (NORCO/VICODIN) 5-325 MG per  tablet Take 2 tablets by mouth every 4 (four) hours as needed. 08/10/14   Rolland Porter, MD  methocarbamol (ROBAXIN) 500 MG tablet Take 1 tablet (500 mg total) by mouth 2 (two) times daily. 08/10/14   Rolland Porter, MD  naproxen (NAPROSYN) 500 MG tablet Take 1 tablet (500 mg total) by mouth 2 (two) times daily. 09/13/15   Rolan Bucco, MD  naproxen sodium (ANAPROX DS) 550 MG tablet Take 1 tablet every 12 hours until stone passes. Best taken with a meal. 03/14/14   Paula Libra, MD  ondansetron (ZOFRAN ODT) 4 MG disintegrating tablet 1  tab q6h PRN nausea/vomiting 03/20/15   Elwin Mocha, MD  ondansetron (ZOFRAN ODT) 8 MG disintegrating tablet Take 1 tablet (8 mg total) by mouth every 8 (eight) hours as needed. 03/14/14   John Molpus, MD  predniSONE (DELTASONE) 20 MG tablet 2 tabs po daily x 4 days 09/24/14   Kathrynn Speed, PA-C  tamsulosin (FLOMAX) 0.4 MG CAPS capsule Take 1 capsule daily until stone passes. 03/14/14   John Molpus, MD   BP 133/86 mmHg  Pulse 80  Temp(Src) 98.2 F (36.8 C) (Oral)  Resp 16  Ht  (1.676 m)  Wt 158 lb (71.668 kg)  BMI 25.51 kg/m2  SpO2 98%  LMP 03/21/2012 Physical Exam  Constitutional: She is oriented to person, place, and time. She appears well-developed and well-nourished.  HENT:  Head: Normocephalic and atraumatic.  Eyes: Pupils are equal, round, and reactive to light.  Neck: Normal range of motion. Neck supple.  Cardiovascular: Normal rate, regular rhythm and normal heart sounds.   Pulmonary/Chest: Effort normal and breath sounds normal. No respiratory distress. She has no wheezes. She has no rales. She exhibits no tenderness.  Abdominal: Soft. Bowel sounds are normal. There is no tenderness. There is no rebound and no guarding.  Musculoskeletal: Normal range of motion. She exhibits no edema.  Tenderness to right mid anterior thigh, no swelling, no redness, no pain on ROM of hip or lumbar spine.  Normal sensation/motor function.  Pedal pulses intact  Lymphadenopathy:    She has no cervical adenopathy.  Neurological: She is alert and oriented to person, place, and time.  Skin: Skin is warm and dry. No rash noted.  Psychiatric: She has a normal mood and affect.   ED Course  Procedures (including critical care time) DIAGNOSTIC STUDIES: Oxygen Saturation is 97% on RA, normal by my interpretation.    COORDINATION OF CARE: 6:06 PM Discussed treatment plan with pt at bedside and pt agreed to plan.   MDM   Final diagnoses:  Muscle strain    Patient presents with pain to her  anterior right thigh. She has no evidence of infection. There is no bony tenderness. There is no neurologic deficits. This is likely musculoskeletal. She was advised in symptomatic care. She was given a prescription for Naprosyn. She was encouraged to follow-up with her PCP if her symptoms are not improving.  I personally performed the services described in this documentation, which was scribed in my presence.  The recorded information has been reviewed and considered.    Rolan Bucco, MD 09/13/15 [phone]

## 2016-05-03 ENCOUNTER — Encounter (HOSPITAL_BASED_OUTPATIENT_CLINIC_OR_DEPARTMENT_OTHER): Payer: Self-pay | Admitting: Emergency Medicine

## 2016-05-03 ENCOUNTER — Emergency Department (HOSPITAL_BASED_OUTPATIENT_CLINIC_OR_DEPARTMENT_OTHER)
Admission: EM | Admit: 2016-05-03 | Discharge: 2016-05-03 | Disposition: A | Discharge status: Home / Self Care | Payer: No Typology Code available for payment source | Attending: Emergency Medicine | Admitting: Emergency Medicine

## 2016-05-03 DIAGNOSIS — Z79899 Other long term (current) drug therapy: Secondary | ICD-10-CM | POA: Insufficient documentation

## 2016-05-03 DIAGNOSIS — L0291 Cutaneous abscess, unspecified: Secondary | ICD-10-CM

## 2016-05-03 DIAGNOSIS — I10 Essential (primary) hypertension: Secondary | ICD-10-CM | POA: Insufficient documentation

## 2016-05-03 DIAGNOSIS — J45909 Unspecified asthma, uncomplicated: Secondary | ICD-10-CM | POA: Insufficient documentation

## 2016-05-03 DIAGNOSIS — L02413 Cutaneous abscess of right upper limb: Secondary | ICD-10-CM | POA: Insufficient documentation

## 2016-05-03 DIAGNOSIS — F17210 Nicotine dependence, cigarettes, uncomplicated: Secondary | ICD-10-CM | POA: Insufficient documentation

## 2016-05-03 DIAGNOSIS — E785 Hyperlipidemia, unspecified: Secondary | ICD-10-CM | POA: Insufficient documentation

## 2016-05-03 MED ORDER — HYDROCODONE-ACETAMINOPHEN 5-325 MG PO TABS: 1.0000 | ORAL_TABLET | Freq: Four times a day (QID) | ORAL | Status: DC | PRN

## 2016-05-03 MED ORDER — LIDOCAINE-EPINEPHRINE (PF) 2 %-1:200000 IJ SOLN: 10.0000 mL | Freq: Once | INTRAMUSCULAR | Status: DC

## 2016-05-03 MED ORDER — HYDROCODONE-ACETAMINOPHEN 5-325 MG PO TABS
2.0000 | ORAL_TABLET | Freq: Once | ORAL | Status: AC
  Administered 2016-05-03: 2 via ORAL
  Filled 2016-05-03: qty 2

## 2016-05-03 MED ORDER — LIDOCAINE-EPINEPHRINE 2 %-1:100000 IJ SOLN
INTRAMUSCULAR | Status: AC
  Administered 2016-05-03: 10 mL
  Filled 2016-05-03: qty 1

## 2016-05-03 MED ORDER — DOXYCYCLINE HYCLATE 100 MG PO CAPS: 100.0000 mg | ORAL_CAPSULE | Freq: Two times a day (BID) | ORAL | Status: DC

## 2016-05-03 NOTE — ED Provider Notes (Signed)
CSN: [id number]     Arrival date & time 05/03/16  1559 History   First MD Initiated Contact with Patient 05/03/16 1610     Chief Complaint  Patient presents with  . Abscess     (Consider location/radiation/quality/duration/timing/severity/associated sxs/prior Treatment) HPI Comments: Patient presents to the ED with a chief complaint of right arm abscess.  She states that she noticed pain and swelling to her right forearm 3 days ago.  States that it started out looking like a zit and has progressively worsened in both size and pain. She rates her pain as severe. She denies any associated symptoms.  Denies fevers, chills, nausea, vomiting, or diarrhea.  She states that she is a former IV drug user, but her last use was over a year ago.  Denies any recent injections at the site.  She denies any bug bites.    The history is provided by the patient. No language interpreter was used.    Past Medical History  Diagnosis Date  . Asthma   . Chronic lower back pain   . Chronic ear infection   . Kidney stones   . Hearing loss in left ear   . Marijuana dependence (HCC)   . Hypertension   . Hyperlipidemia   . Heroin addiction [facility](HCC)    Past Surgical History  Procedure Laterality Date  . Dilation and curettage of uterus    . Tubal ligation    . Abdominal hysterectomy     No family history on file. Social History  Substance Use Topics  . Smoking status: Current Every Day Smoker -- 1.00 packs/day for 15 years    Types: Cigarettes  . Smokeless tobacco: None  . Alcohol Use: Yes     Comment: occasional   OB History    No data available     Review of Systems  Constitutional: Negative for fever and chills.  Respiratory: Negative for shortness of breath.   Cardiovascular: Negative for chest pain.  Gastrointestinal: Negative for nausea, vomiting, diarrhea and constipation.  Genitourinary: Negative for dysuria.  Skin: Positive for color change.      Allergies  Amoxicillin; Eggs or  egg-derived products; Penicillins; and Wheat bran  Home Medications   Prior to Admission medications   Medication Sig Start Date End Date Taking? Authorizing Provider  GABAPENTIN, ONCE-DAILY, PO Take by mouth.   Yes Historical Provider, MD  ciprofloxacin (CIPRO) 500 MG tablet Take 1 tablet (500 mg total) by mouth 2 (two) times daily. 03/20/15   Elwin MochaBlair Walden, MD  CLONIDINE HCL PO Take by mouth.    Historical Provider, MD  HYDROcodone-acetaminophen (NORCO/VICODIN) 5-325 MG per tablet Take 2 tablets by mouth every 4 (four) hours as needed. 08/10/14   Rolland PorterMark James, MD  ibuprofen (ADVIL,MOTRIN) 200 MG tablet Take 200 mg by mouth every 6 (six) hours as needed.    Historical Provider, MD  loratadine (CLARITIN) 10 MG tablet Take 10 mg by mouth daily.    Historical Provider, MD  methocarbamol (ROBAXIN) 500 MG tablet Take 1 tablet (500 mg total) by mouth 2 (two) times daily. 08/10/14   Rolland PorterMark James, MD  naproxen (NAPROSYN) 500 MG tablet Take 1 tablet (500 mg total) by mouth 2 (two) times daily. 09/13/15   Rolan BuccoMelanie Belfi, MD  tamsulosin (FLOMAX) 0.4 MG CAPS capsule Take 1 capsule daily until stone passes. 03/14/14   John Molpus, MD   BP 120/79 mmHg  Pulse 92  Temp(Src) 99.2 F (37.3 C) (Oral)  Resp 18  Ht 5\' 6"  (  1.676 m)  Wt 79.379 kg  BMI 28.26 kg/m2  SpO2 95%  LMP 03/21/2012 Physical Exam  Constitutional: She is oriented to person, place, and time. She appears well-developed and well-nourished.  HENT:  Head: Normocephalic and atraumatic.  Eyes: Conjunctivae and EOM are normal. Pupils are equal, round, and reactive to light.  Neck: Normal range of motion. Neck supple.  Cardiovascular: Normal rate and regular rhythm.  Exam reveals no gallop and no friction rub.   No murmur heard. Pulmonary/Chest: Effort normal and breath sounds normal. No respiratory distress. She has no wheezes. She has no rales. She exhibits no tenderness.  Abdominal: Soft. Bowel sounds are normal. She exhibits no distension and no  mass. There is no tenderness. There is no rebound and no guarding.  Musculoskeletal: Normal range of motion. She exhibits no edema or tenderness.  Neurological: She is alert and oriented to person, place, and time.  Skin: Skin is warm and dry. There is erythema.  2x2 cm abscess to right anterior forearm with extensive surrounding cellulitis extending up the forearm to nearly the anticubital fossa and distally to just above the wrist  Psychiatric: She has a normal mood and affect. Her behavior is normal. Judgment and thought content normal.  Nursing note and vitals reviewed.   ED Course  Procedures (including critical care time)  INCISION AND DRAINAGE Performed by: Roxy Horseman Consent: Verbal consent obtained. Risks and benefits: risks, benefits and alternatives were discussed Type: abscess  Body area: right forearm  Anesthesia: local infiltration  Incision was made with a scalpel.  Local anesthetic: lidocaine 2% with epinephrine  Anesthetic total: 3 ml  Complexity: complex Blunt dissection to break up loculations  Drainage: purulent  Drainage amount: large  Packing material: none  Patient tolerance: Patient tolerated the procedure well with no immediate complications.    MDM   Final diagnoses:  Abscess   Patient with skin abscess amenable to incision and drainage.  Abscess was not large enough to warrant packing or drain,  Recommend wound recheck in 2-3 days by PCP, return here for worsening symptoms.  Patient will need abx due to surrounding cellulitis.. Encouraged home warm soaks and flushing.       Roxy Horseman, PA-C 05/03/16 1732  Marily Memos, MD 05/04/16 [phone]

## 2016-05-03 NOTE — ED Notes (Signed)
Abscess rt forearm

## 2016-05-03 NOTE — Discharge Instructions (Signed)
Abscess °An abscess is an infected area that contains a collection of pus and debris. It can occur in almost any part of the body. An abscess is also known as a furuncle or boil. °CAUSES  °An abscess occurs when tissue gets infected. This can occur from blockage of oil or sweat glands, infection of hair follicles, or a minor injury to the skin. As the body tries to fight the infection, pus collects in the area and creates pressure under the skin. This pressure causes pain. People with weakened immune systems have difficulty fighting infections and get certain abscesses more often.  °SYMPTOMS °Usually an abscess develops on the skin and becomes a painful mass that is red, warm, and tender. If the abscess forms under the skin, you may feel a moveable soft area under the skin. Some abscesses break open (rupture) on their own, but most will continue to get worse without care. The infection can spread deeper into the body and eventually into the bloodstream, causing you to feel ill.  °DIAGNOSIS  °Your caregiver will take your medical history and perform a physical exam. A sample of fluid may also be taken from the abscess to determine what is causing your infection. °TREATMENT  °Your caregiver may prescribe antibiotic medicines to fight the infection. However, taking antibiotics alone usually does not cure an abscess. Your caregiver may need to make a small cut (incision) in the abscess to drain the pus. In some cases, gauze is packed into the abscess to reduce pain and to continue draining the area. °HOME CARE INSTRUCTIONS  °· Only take over-the-counter or prescription medicines for pain, discomfort, or fever as directed by your caregiver. °· If you were prescribed antibiotics, take them as directed. Finish them even if you start to feel better. °· If gauze is used, follow your caregiver's directions for changing the gauze. °· To avoid spreading the infection: °· Keep your draining abscess covered with a  bandage. °· Wash your hands well. °· Do not share personal care items, towels, or whirlpools with others. °· Avoid skin contact with others. °· Keep your skin and clothes clean around the abscess. °· Keep all follow-up appointments as directed by your caregiver. °SEEK MEDICAL CARE IF:  °· You have increased pain, swelling, redness, fluid drainage, or bleeding. °· You have muscle aches, chills, or a general ill feeling. °· You have a fever. °MAKE SURE YOU:  °· Understand these instructions. °· Will watch your condition. °· Will get help right away if you are not doing well or get worse. °  °This information is not intended to replace advice given to you by your health care provider. Make sure you discuss any questions you have with your health care provider. °  °Document Released: 08/25/2005 Document Revised: 05/16/2012 Document Reviewed: 01/28/2012 °Elsevier Interactive Patient Education ©2016 [employer] ° °Incision and Drainage °Incision and drainage is a procedure in which a sac-like structure (cystic structure) is opened and drained. The area to be drained usually contains material such as pus, fluid, or blood.  °LET YOUR CAREGIVER KNOW ABOUT:  °· Allergies to medicine. °· Medicines taken, including vitamins, herbs, eyedrops, over-the-counter medicines, and creams. °· Use of steroids (by mouth or creams). °· Previous problems with anesthetics or numbing medicines. °· History of bleeding problems or blood clots. °· Previous surgery. °· Other health problems, including diabetes and kidney problems. °· Possibility of pregnancy, if this applies. °RISKS AND COMPLICATIONS °· Pain. °· Bleeding. °· Scarring. °· Infection. °BEFORE THE PROCEDURE  °  You may need to have an ultrasound or other imaging tests to see how large or deep your cystic structure is. Blood tests may also be used to determine if you have an infection or how severe the infection is. You may need to have a tetanus shot. °PROCEDURE  °The affected area  is cleaned with a cleaning fluid. The cyst area will then be numbed with a medicine (local anesthetic). A small incision will be made in the cystic structure. A syringe or catheter may be used to drain the contents of the cystic structure, or the contents may be squeezed out. The area will then be flushed with a cleansing solution. After cleansing the area, it is often gently packed with a gauze or another wound dressing. Once it is packed, it will be covered with gauze and tape or some other type of wound dressing.  °AFTER THE PROCEDURE  °· Often, you will be allowed to go home right after the procedure. °· You may be given antibiotic medicine to prevent or heal an infection. °· If the area was packed with gauze or some other wound dressing, you will likely need to come back in 1 to 2 days to get it removed. °· The area should heal in about 14 days. °  °This information is not intended to replace advice given to you by your health care provider. Make sure you discuss any questions you have with your health care provider. °  °Document Released: 05/11/2001 Document Revised: 05/16/2012 Document Reviewed: 01/10/2012 °Elsevier Interactive Patient Education ©2016 [employer] ° °

## 2016-05-03 NOTE — ED Notes (Signed)
I & D tray to bedside.  

## 2016-10-03 ENCOUNTER — Emergency Department (HOSPITAL_BASED_OUTPATIENT_CLINIC_OR_DEPARTMENT_OTHER)
Admission: EM | Admit: 2016-10-03 | Discharge: 2016-10-03 | Disposition: A | Discharge status: Home / Self Care | Payer: Medicaid Other | Attending: Emergency Medicine | Admitting: Emergency Medicine

## 2016-10-03 ENCOUNTER — Emergency Department (HOSPITAL_BASED_OUTPATIENT_CLINIC_OR_DEPARTMENT_OTHER): Payer: Medicaid Other

## 2016-10-03 ENCOUNTER — Encounter (HOSPITAL_BASED_OUTPATIENT_CLINIC_OR_DEPARTMENT_OTHER): Payer: Self-pay | Admitting: Emergency Medicine

## 2016-10-03 DIAGNOSIS — Y929 Unspecified place or not applicable: Secondary | ICD-10-CM | POA: Insufficient documentation

## 2016-10-03 DIAGNOSIS — F17210 Nicotine dependence, cigarettes, uncomplicated: Secondary | ICD-10-CM | POA: Insufficient documentation

## 2016-10-03 DIAGNOSIS — S93431A Sprain of tibiofibular ligament of right ankle, initial encounter: Secondary | ICD-10-CM | POA: Insufficient documentation

## 2016-10-03 DIAGNOSIS — Z79899 Other long term (current) drug therapy: Secondary | ICD-10-CM | POA: Insufficient documentation

## 2016-10-03 DIAGNOSIS — I10 Essential (primary) hypertension: Secondary | ICD-10-CM | POA: Insufficient documentation

## 2016-10-03 DIAGNOSIS — S93491A Sprain of other ligament of right ankle, initial encounter: Secondary | ICD-10-CM

## 2016-10-03 DIAGNOSIS — Y9389 Activity, other specified: Secondary | ICD-10-CM | POA: Insufficient documentation

## 2016-10-03 DIAGNOSIS — W1839XA Other fall on same level, initial encounter: Secondary | ICD-10-CM | POA: Insufficient documentation

## 2016-10-03 DIAGNOSIS — Y999 Unspecified external cause status: Secondary | ICD-10-CM | POA: Insufficient documentation

## 2016-10-03 DIAGNOSIS — J45909 Unspecified asthma, uncomplicated: Secondary | ICD-10-CM | POA: Insufficient documentation

## 2016-10-03 MED ORDER — OXYCODONE-ACETAMINOPHEN 5-325 MG PO TABS
1.0000 | ORAL_TABLET | Freq: Once | ORAL | Status: AC
  Administered 2016-10-03: 1 via ORAL
  Filled 2016-10-03: qty 1

## 2016-10-03 MED ORDER — IBUPROFEN 800 MG PO TABS
800.0000 mg | ORAL_TABLET | Freq: Once | ORAL | Status: AC
  Administered 2016-10-03: 800 mg via ORAL
  Filled 2016-10-03: qty 1

## 2016-10-03 NOTE — ED Notes (Signed)
C/o R 10/10 ankle pain with significant swelling to lateral ankle, admits to previous numbness/tingling d/t nerve damage, states, "had been sitting for awhile, went to stand and twisted ankle and fell d/t decreased sensation", (denies: head, neck or back injury; denies: other injury or sx), CMS intact, ROM limited d/t pain and swelling, no meds PTA. Stands at work as Conservation officer, [employer]. Has family members crutches in car. EDP into room

## 2016-10-03 NOTE — ED Notes (Signed)
CMS intact post splint at time of d/c

## 2016-10-03 NOTE — ED Provider Notes (Signed)
[facility] Provider Note   CSN: [id number] Arrival date & time: 10/03/16  1935  By signing my name below, I, Octavia Heir, attest that this documentation has been prepared under the direction and in the presence of Nira Conn, MD.  Electronically Signed: Octavia Heir, ED Scribe. 10/03/16. 8:32 PM.    History   Chief Complaint Chief Complaint  Patient presents with  . Ankle Injury    The history is provided by the patient. No language interpreter was used.   HPI Comments: Jennifer Ballard is a 35 y.o. female who presents to the Emergency Department complaining of sudden onset, right ankle pain s/p an injury that occurred earlier this evening. She has associated swelling to her right ankle. Pt says that she was doing homework on her bed and her right leg fell asleep. She reports that she got out of bed, twisted her ankle and heard a "pop" in her right ankle. Pt has been applying ice to the area. She has not taken any medication to alleviate her pain. Denies numbness or any other complaints.   Past Medical History:  Diagnosis Date  . Asthma   . Chronic ear infection   . Chronic lower back pain   . Hearing loss in left ear   . Heroin addiction (HCC)   . Hyperlipidemia   . Hypertension   . Kidney stones   . Marijuana dependence [facility])     Patient Active Problem List   Diagnosis Date Noted  . Left ankle injury 08/03/2012    Past Surgical History:  Procedure Laterality Date  . ABDOMINAL HYSTERECTOMY    . DILATION AND CURETTAGE OF UTERUS    . TUBAL LIGATION      OB History    No data available       Home Medications    Prior to Admission medications   Medication Sig Start Date End Date Taking? Authorizing Provider  ciprofloxacin (CIPRO) 500 MG tablet Take 1 tablet (500 mg total) by mouth 2 (two) times daily. 03/20/15   Elwin Mocha, MD  CLONIDINE HCL PO Take by mouth.    Historical Provider, MD  doxycycline (VIBRAMYCIN) 100 MG capsule Take 1  capsule (100 mg total) by mouth 2 (two) times daily. 05/03/16   Roxy Horseman, PA-C  GABAPENTIN, ONCE-DAILY, PO Take by mouth.    Historical Provider, MD  HYDROcodone-acetaminophen (NORCO/VICODIN) 5-325 MG tablet Take 1-2 tablets by mouth every 6 (six) hours as needed. 05/03/16   Roxy Horseman, PA-C  ibuprofen (ADVIL,MOTRIN) 200 MG tablet Take 200 mg by mouth every 6 (six) hours as needed.    Historical Provider, MD  loratadine (CLARITIN) 10 MG tablet Take 10 mg by mouth daily.    Historical Provider, MD  methocarbamol (ROBAXIN) 500 MG tablet Take 1 tablet (500 mg total) by mouth 2 (two) times daily. 08/10/14   Rolland Porter, MD  naproxen (NAPROSYN) 500 MG tablet Take 1 tablet (500 mg total) by mouth 2 (two) times daily. 09/13/15   Rolan Bucco, MD  tamsulosin (FLOMAX) 0.4 MG CAPS capsule Take 1 capsule daily until stone passes. 03/14/14   Paula Libra, MD    Family History History reviewed. No pertinent family history.  Social History Social History  Substance Use Topics  . Smoking status: Current Every Day Smoker    Packs/day: 1.00    Years: 15.00    Types: Cigarettes  . Smokeless tobacco: Not on file  . Alcohol use Yes     Comment: occasional  Allergies   Amoxicillin; Eggs or egg-derived products; Penicillins; and Wheat bran   Review of Systems Review of Systems  A complete 10 system review of systems was obtained and all systems are negative except as noted in the HPI and PMH.    Physical Exam Updated Vital Signs BP 128/81   Pulse 81   Temp 98.3 F (36.8 C)   Resp 18   Ht 5\' 6"  (1.676 m)   Wt 175 lb (79.4 kg)   LMP 03/21/2012   SpO2 97%   BMI 28.25 kg/m   Physical Exam  Constitutional: She is oriented to person, place, and time. She appears well-developed and well-nourished. No distress.  HENT:  Head: Normocephalic and atraumatic.  Right Ear: External ear normal.  Left Ear: External ear normal.  Nose: Nose normal.  Eyes: Conjunctivae and EOM are normal. No  scleral icterus.  Neck: Normal range of motion and phonation normal.  Cardiovascular: Normal rate and regular rhythm.   Pulmonary/Chest: Effort normal. No stridor. No respiratory distress.  Abdominal: She exhibits no distension.  Musculoskeletal: Normal range of motion. She exhibits edema and tenderness.       Right ankle: She exhibits swelling (R lateral malleolus ). Tenderness. Lateral malleolus and AITFL tenderness found. No head of 5th metatarsal and no proximal fibula tenderness found.       Feet:  Swelling and tenderness to right malleolus and tenderness to the distal tib/fib. No pain at tip of 5th metatarsal  Neurological: She is alert and oriented to person, place, and time.  Skin: She is not diaphoretic.  Psychiatric: She has a normal mood and affect. Her behavior is normal.  Vitals reviewed.    ED Treatments / Results  DIAGNOSTIC STUDIES: Oxygen Saturation is 97% on RA, normal by my interpretation.  COORDINATION OF CARE:  8:29 PM Discussed treatment plan with pt at bedside and pt agreed to plan.  Labs (all labs ordered are listed, but only abnormal results are displayed) Labs Reviewed - No data to display  EKG  EKG Interpretation None       Radiology Dg Ankle Complete Right  Result Date: 10/03/2016 CLINICAL DATA:  Injured ankle today. EXAM: RIGHT ANKLE - COMPLETE 3+ VIEW COMPARISON:  None. FINDINGS: The ankle mortise is maintained. No acute ankle fracture or osteochondral lesion. No definite joint effusion. The mid and hindfoot bony structures are intact. IMPRESSION: No acute fracture. Electronically Signed   By: Rudie MeyerP.  Gallerani M.D.   On: 10/03/2016 20:16    Procedures Procedures (including critical care time)  Medications Ordered in ED Medications  oxyCODONE-acetaminophen (PERCOCET/ROXICET) 5-325 MG per tablet 1 tablet (1 tablet Oral Given 10/03/16 2019)  ibuprofen (ADVIL,MOTRIN) tablet 800 mg (800 mg Oral Given 10/03/16 2019)     Initial Impression /  Assessment and Plan / ED Course  I have reviewed the triage vital signs and the nursing notes.  Pertinent labs & imaging results that were available during my care of the patient were reviewed by me and considered in my medical decision making (see chart for details).  Clinical Course     Ankle sprain. Pain film without evidence of fracture or dislocation. No proximal fibular tenderness to suggest [place]. Provided with by mouth pain meds. Ace wrap and Aircast provided.  Safe for discharge with strict return precautions.  Final Clinical Impressions(s) / ED Diagnoses   Final diagnoses:  Sprain of anterior talofibular ligament of right ankle, initial encounter   I personally performed the services described in this documentation,  which was scribed in my presence. The recorded information has been reviewed and is accurate.   Disposition: Discharge  Condition: Good  I have discussed the results, Dx and Tx plan with the patient who expressed understanding and agree(s) with the plan. Discharge instructions discussed at great length. The patient was given strict return precautions who verbalized understanding of the instructions. No further questions at time of discharge.    Current Discharge Medication List      Follow Up: Caffie DammeKarla Smith, MD [address] [place] [id number] [phone]  Schedule an appointment as soon as possible for a visit  As needed      Nira ConnPedro Eduardo Cardama, MD 10/03/16 2123

## 2016-10-03 NOTE — ED Notes (Signed)
Pt not in room, pt in xray.  

## 2016-10-03 NOTE — ED Triage Notes (Signed)
Pt c/o R ankle injury today, twisted when she got up from bed. Significant swelling noted. Pt alert, interactive, tearful.

## 2017-09-27 ENCOUNTER — Encounter (HOSPITAL_BASED_OUTPATIENT_CLINIC_OR_DEPARTMENT_OTHER): Payer: Self-pay

## 2017-09-27 ENCOUNTER — Emergency Department (HOSPITAL_BASED_OUTPATIENT_CLINIC_OR_DEPARTMENT_OTHER)
Admission: EM | Admit: 2017-09-27 | Discharge: 2017-09-27 | Disposition: A | Discharge status: Home / Self Care | Payer: Self-pay | Attending: Emergency Medicine | Admitting: Emergency Medicine

## 2017-09-27 DIAGNOSIS — K047 Periapical abscess without sinus: Secondary | ICD-10-CM | POA: Insufficient documentation

## 2017-09-27 DIAGNOSIS — F17210 Nicotine dependence, cigarettes, uncomplicated: Secondary | ICD-10-CM | POA: Insufficient documentation

## 2017-09-27 DIAGNOSIS — F1220 Cannabis dependence, uncomplicated: Secondary | ICD-10-CM | POA: Insufficient documentation

## 2017-09-27 DIAGNOSIS — F1120 Opioid dependence, uncomplicated: Secondary | ICD-10-CM | POA: Insufficient documentation

## 2017-09-27 DIAGNOSIS — Z79899 Other long term (current) drug therapy: Secondary | ICD-10-CM | POA: Insufficient documentation

## 2017-09-27 DIAGNOSIS — J45909 Unspecified asthma, uncomplicated: Secondary | ICD-10-CM | POA: Insufficient documentation

## 2017-09-27 DIAGNOSIS — I10 Essential (primary) hypertension: Secondary | ICD-10-CM | POA: Insufficient documentation

## 2017-09-27 MED ORDER — CLINDAMYCIN HCL 150 MG PO CAPS
300.0000 mg | ORAL_CAPSULE | Freq: Once | ORAL | Status: AC
  Administered 2017-09-27: 300 mg via ORAL
  Filled 2017-09-27: qty 2

## 2017-09-27 MED ORDER — TRAMADOL HCL 50 MG PO TABS: 50.0000 mg | ORAL_TABLET | Freq: Four times a day (QID) | ORAL | 0 refills | Status: DC | PRN

## 2017-09-27 MED ORDER — IBUPROFEN 600 MG PO TABS: 600.0000 mg | ORAL_TABLET | Freq: Four times a day (QID) | ORAL | 0 refills | Status: DC | PRN

## 2017-09-27 MED ORDER — CLINDAMYCIN HCL 150 MG PO CAPS: 300.0000 mg | ORAL_CAPSULE | Freq: Four times a day (QID) | ORAL | 0 refills | Status: DC

## 2017-09-27 NOTE — ED Triage Notes (Signed)
C/o left lower toothache x months-NAD-steady gait

## 2017-09-27 NOTE — ED Notes (Signed)
ED Provider at bedside. 

## 2017-09-27 NOTE — ED Provider Notes (Signed)
[facility] EMERGENCY DEPARTMENT Provider Note   CSN: [id number] Arrival date & time: 09/27/17  1103     History   Chief Complaint Chief Complaint  Patient presents with  . Dental Pain    HPI Jennifer Ballard is a 36 y.o. female.  HPI Jennifer Ballard is a [age] y.o. female presents to emergency department complaining of dental pain.  Patient does have history of heroin addiction and polysubstance dependence.  She states that she developed pain in the left lower lateral incisor approximately 3 days ago.  She states since yesterday she has developed facial swelling, pain, unable to chew.  She called the dentist who told her that they are unable to see her until her infection improves.  She denies any fever or chills.  No swelling under the tongue.  She states she does have facial swelling over the left lower lip and chin.  She denies any trouble swallowing or breathing.  She has taken over-the-counter pain killers with no relief of her symptoms.  No history of problems with that to the prior facial or dental infections.  Past Medical History:  Diagnosis Date  . Asthma   . Chronic ear infection   . Chronic lower back pain   . Hearing loss in left ear   . Heroin addiction (HCC)   . Hyperlipidemia   . Hypertension   . Kidney stones   . Marijuana dependence [facility](HCC)     Patient Active Problem List   Diagnosis Date Noted  . Left ankle injury 08/03/2012    Past Surgical History:  Procedure Laterality Date  . ABDOMINAL HYSTERECTOMY    . DILATION AND CURETTAGE OF UTERUS    . TUBAL LIGATION      OB History    No data available       Home Medications    Prior to Admission medications   Medication Sig Start Date End Date Taking? Authorizing Provider  AMITRIPTYLINE HCL PO Take by mouth.   Yes [provider]  HydrOXYzine Pamoate (VISTARIL PO) Take by mouth.   Yes [provider]  MELOXICAM PO Take by mouth.   Yes [provider]    Pseudoephedrine-Guaifenesin (MUCINEX D PO) Take by mouth.   Yes [provider]  GABAPENTIN, ONCE-DAILY, PO Take by mouth.    [provider]  ibuprofen (ADVIL,MOTRIN) 200 MG tablet Take 200 mg by mouth every 6 (six) hours as needed.    [provider]  methocarbamol (ROBAXIN) 500 MG tablet Take 1 tablet (500 mg total) by mouth 2 (two) times daily. 08/10/14   Rolland PorterJames, Mark, MD    Family History No family history on file.  Social History Social History  Substance Use Topics  . Smoking status: Current Every Day Smoker    Packs/day: 1.00    Years: 15.00    Types: Cigarettes  . Smokeless tobacco: Never Used  . Alcohol use Yes     Comment: occasional     Allergies   Amoxicillin; Eggs or egg-derived products; Penicillins; and Wheat bran   Review of Systems Review of Systems  Constitutional: Negative for chills and fever.  HENT: Positive for dental problem and facial swelling. Negative for trouble swallowing.   Respiratory: Negative for shortness of breath.   Neurological: Positive for headaches. Negative for dizziness and light-headedness.     Physical Exam Updated Vital Signs BP 124/84 (BP Location: Right Arm)   Pulse 83   Temp 97.9 F (36.6 C) (Oral)  Resp 18   Ht 5\' 6"  (1.676 m)   Wt 77.1 kg (170 lb)   LMP 03/21/2012   SpO2 95%   BMI 27.44 kg/m   Physical Exam  Constitutional: She appears well-developed and well-nourished. No distress.  HENT:  Facial swelling noted to the left lower lip, left chin.  There is induration with palpation.  No erythema.  Left lower central incisor appears to be normal however there is significant gum swelling surrounding the tooth with purulent active drainage.  No swelling under the tongue.  No submandibular fullness or tenderness.  No trismus.  Eyes: Conjunctivae are normal.  Neck: Neck supple.  Neurological: She is alert.  Skin: Skin is warm and dry.  Nursing note and vitals reviewed.    ED Treatments  / Results  Labs (all labs ordered are listed, but only abnormal results are displayed) Labs Reviewed - No data to display  EKG  EKG Interpretation None       Radiology No results found.  Procedures Procedures (including critical care time)  Medications Ordered in ED Medications  clindamycin (CLEOCIN) capsule 300 mg (300 mg Oral Given 09/27/17 1407)     Initial Impression / Assessment and Plan / ED Course  I have reviewed the triage vital signs and the nursing notes.  Pertinent labs & imaging results that were available during my care of the patient were reviewed by me and considered in my medical decision making (see chart for details).     Patient with obvious draining dental abscess, appears to be adjacent to the left lower central incisor.  There is no evidence of Ludwig's angina.  She is afebrile.  Nontoxic appearing.  She is allergic to penicillins, will start on clindamycin for infection.  Advised to do warm compresses.  Follow-up with a dentist in 1 week.  Return precautions discussed.  Vitals:   09/27/17 1118  BP: 124/84  Pulse: 83  Resp: 18  Temp: 97.9 F (36.6 C)  TempSrc: Oral  SpO2: 95%  Weight: 77.1 kg (170 lb)  Height: 5\' 6"  (1.676 m)     Final Clinical Impressions(s) / ED Diagnoses   Final diagnoses:  Dental abscess    New Prescriptions New Prescriptions   CLINDAMYCIN (CLEOCIN) 150 MG CAPSULE    Take 2 capsules (300 mg total) by mouth every 6 (six) hours.   IBUPROFEN (ADVIL,MOTRIN) 600 MG TABLET    Take 1 tablet (600 mg total) by mouth every 6 (six) hours as needed.   TRAMADOL (ULTRAM) 50 MG TABLET    Take 1 tablet (50 mg total) by mouth every 6 (six) hours as needed.     Jaynie Crumble, PA-C 09/27/17 1422    Alvira Monday, MD 09/29/17 1244

## 2017-09-27 NOTE — Discharge Instructions (Signed)
Take clindamycin as prescribed until all gone.  Ibuprofen for pain.  Tramadol for severe pain.  Warm compresses and salt water mouth rinses.  Follow-up with a dentist in 1 week. Return if worsening

## 2020-07-17 ENCOUNTER — Emergency Department (HOSPITAL_BASED_OUTPATIENT_CLINIC_OR_DEPARTMENT_OTHER)
Admission: EM | Admit: 2020-07-17 | Discharge: 2020-07-17 | Disposition: A | Discharge status: Home / Self Care | Payer: Medicaid Other | Attending: Emergency Medicine | Admitting: Emergency Medicine

## 2020-07-17 ENCOUNTER — Other Ambulatory Visit: Payer: Self-pay

## 2020-07-17 ENCOUNTER — Emergency Department (HOSPITAL_BASED_OUTPATIENT_CLINIC_OR_DEPARTMENT_OTHER): Payer: Medicaid Other

## 2020-07-17 ENCOUNTER — Encounter (HOSPITAL_BASED_OUTPATIENT_CLINIC_OR_DEPARTMENT_OTHER): Payer: Self-pay | Admitting: *Deleted

## 2020-07-17 DIAGNOSIS — J45909 Unspecified asthma, uncomplicated: Secondary | ICD-10-CM | POA: Insufficient documentation

## 2020-07-17 DIAGNOSIS — R112 Nausea with vomiting, unspecified: Secondary | ICD-10-CM | POA: Diagnosis not present

## 2020-07-17 DIAGNOSIS — Z79899 Other long term (current) drug therapy: Secondary | ICD-10-CM | POA: Insufficient documentation

## 2020-07-17 DIAGNOSIS — I10 Essential (primary) hypertension: Secondary | ICD-10-CM | POA: Diagnosis not present

## 2020-07-17 DIAGNOSIS — R197 Diarrhea, unspecified: Secondary | ICD-10-CM | POA: Diagnosis present

## 2020-07-17 DIAGNOSIS — Z20822 Contact with and (suspected) exposure to covid-19: Secondary | ICD-10-CM | POA: Insufficient documentation

## 2020-07-17 DIAGNOSIS — F17210 Nicotine dependence, cigarettes, uncomplicated: Secondary | ICD-10-CM | POA: Diagnosis not present

## 2020-07-17 DIAGNOSIS — F1290 Cannabis use, unspecified, uncomplicated: Secondary | ICD-10-CM | POA: Insufficient documentation

## 2020-07-17 LAB — CBC WITH DIFFERENTIAL/PLATELET
Abs Immature Granulocytes: 0.04 10*3/uL (ref 0.00–0.07)
Basophils Absolute: 0 10*3/uL (ref 0.0–0.1)
Basophils Relative: 0 %
Eosinophils Absolute: 0 10*3/uL (ref 0.0–0.5)
Eosinophils Relative: 0 %
HCT: 47.7 % — ABNORMAL HIGH (ref 36.0–46.0)
Hemoglobin: 15.3 g/dL — ABNORMAL HIGH (ref 12.0–15.0)
Immature Granulocytes: 0 %
Lymphocytes Relative: 27 %
Lymphs Abs: 3.4 10*3/uL (ref 0.7–4.0)
MCH: 29.1 pg (ref 26.0–34.0)
MCHC: 32.1 g/dL (ref 30.0–36.0)
MCV: 90.9 fL (ref 80.0–100.0)
Monocytes Absolute: 0.5 10*3/uL (ref 0.1–1.0)
Monocytes Relative: 4 %
Neutro Abs: 8.6 10*3/uL — ABNORMAL HIGH (ref 1.7–7.7)
Neutrophils Relative %: 69 %
Platelets: 284 10*3/uL (ref 150–400)
RBC: 5.25 MIL/uL — ABNORMAL HIGH (ref 3.87–5.11)
RDW: 13.1 % (ref 11.5–15.5)
WBC: 12.6 10*3/uL — ABNORMAL HIGH (ref 4.0–10.5)
nRBC: 0 % (ref 0.0–0.2)

## 2020-07-17 LAB — URINALYSIS, ROUTINE W REFLEX MICROSCOPIC
Bilirubin Urine: NEGATIVE
Glucose, UA: NEGATIVE mg/dL
Hgb urine dipstick: NEGATIVE
Ketones, ur: NEGATIVE mg/dL
Leukocytes,Ua: NEGATIVE
Nitrite: NEGATIVE
Protein, ur: NEGATIVE mg/dL
Specific Gravity, Urine: >1.03 — ABNORMAL HIGH (ref 1.005–1.030)
pH: 6.5 (ref 5.0–8.0)

## 2020-07-17 LAB — COMPREHENSIVE METABOLIC PANEL
ALT: 56 U/L — ABNORMAL HIGH (ref 0–44)
AST: 37 U/L (ref 15–41)
Albumin: 4.2 g/dL (ref 3.5–5.0)
Alkaline Phosphatase: 52 U/L (ref 38–126)
Anion gap: 10 (ref 5–15)
BUN: 15 mg/dL (ref 6–20)
CO2: 28 mmol/L (ref 22–32)
Calcium: 9.1 mg/dL (ref 8.9–10.3)
Chloride: 101 mmol/L (ref 98–111)
Creatinine, Ser: 0.62 mg/dL (ref 0.44–1.00)
GFR calc Af Amer: >60 mL/min (ref >60)
GFR calc non Af Amer: >60 mL/min (ref >60)
Glucose, Bld: 78 mg/dL (ref 70–99)
Potassium: 3.8 mmol/L (ref 3.5–5.1)
Sodium: 139 mmol/L (ref 135–145)
Total Bilirubin: 0.6 mg/dL (ref 0.3–1.2)
Total Protein: 8.5 g/dL — ABNORMAL HIGH (ref 6.5–8.1)

## 2020-07-17 LAB — SARS CORONAVIRUS 2 BY RT PCR (HOSPITAL ORDER, PERFORMED IN ~~LOC~~ HOSPITAL LAB): SARS Coronavirus 2: NEGATIVE

## 2020-07-17 LAB — LACTIC ACID, PLASMA: Lactic Acid, Venous: 1 mmol/L (ref 0.5–1.9)

## 2020-07-17 LAB — LIPASE, BLOOD: Lipase: 17 U/L (ref 11–51)

## 2020-07-17 MED ORDER — ACETAMINOPHEN 325 MG PO TABS
650.0000 mg | ORAL_TABLET | Freq: Once | ORAL | Status: AC
  Administered 2020-07-17: 650 mg via ORAL
  Filled 2020-07-17: qty 2

## 2020-07-17 MED ORDER — ONDANSETRON HCL 4 MG/2ML IJ SOLN
4.0000 mg | Freq: Once | INTRAMUSCULAR | Status: AC
  Administered 2020-07-17: 4 mg via INTRAVENOUS
  Filled 2020-07-17: qty 2

## 2020-07-17 MED ORDER — ONDANSETRON HCL 4 MG PO TABS: 4.0000 mg | ORAL_TABLET | Freq: Three times a day (TID) | ORAL | 0 refills | Status: DC | PRN

## 2020-07-17 MED ORDER — DOXYCYCLINE HYCLATE 100 MG PO CAPS: 100.0000 mg | ORAL_CAPSULE | Freq: Two times a day (BID) | ORAL | 0 refills | Status: DC

## 2020-07-17 NOTE — ED Notes (Signed)
ED Provider at bedside. 

## 2020-07-17 NOTE — ED Notes (Addendum)
Patient ambulated to X-ray 

## 2020-07-17 NOTE — ED Notes (Signed)
1 unsuccessful attempt for IV. Windell Moulding RN will attempt with [place]

## 2020-07-17 NOTE — ED Notes (Signed)
Pt discharged to home. Discharge instructions have been discussed with patient and/or family members. Pt verbally acknowledges understanding d/c instructions, and endorses comprehension to checkout at registration before leaving.  °

## 2020-07-17 NOTE — ED Provider Notes (Signed)
[facility] EMERGENCY DEPARTMENT Provider Note   CSN: [id number] Arrival date & time: 07/17/20  1037     History Chief Complaint  Patient presents with  . Diarrhea    JASDEEP DEJARNETT is a 39 y.o. female who presents emergency department with chief complaint of nausea vomiting and diarrhea.  The patient has had 5 days of nausea vomiting and diarrhea.  She denies abdominal pain.  She has had fevers, headaches, body aches, chills.  She denies urinary symptoms, sore throat. Review of EMR shows that the patient has a previous hx of IVDU. HPI     Past Medical History:  Diagnosis Date  . Asthma   . Chronic ear infection   . Chronic lower back pain   . Hearing loss in left ear   . Heroin addiction (HCC)   . Hyperlipidemia   . Hypertension   . Kidney stones   . Marijuana dependence [facility] [place])     Patient Active Problem List   Diagnosis Date Noted  . Left ankle injury 08/03/2012    Past Surgical History:  Procedure Laterality Date  . ABDOMINAL HYSTERECTOMY    . DILATION AND CURETTAGE OF UTERUS    . TUBAL LIGATION       OB History   No obstetric history on file.     No family history on file.  Social History   Tobacco Use  . Smoking status: Current Every Day Smoker    Packs/day: 1.00    Years: 15.00    Pack years: 15.00    Types: Cigarettes  . Smokeless tobacco: Never Used  Substance Use Topics  . Alcohol use: Yes    Comment: occasional  . Drug use: Yes    Types: Marijuana    Comment: states clean x 3 mos from heroin    Home Medications Prior to Admission medications   Medication Sig Start Date End Date Taking? Authorizing Provider  ibuprofen (ADVIL,MOTRIN) 600 MG tablet Take 1 tablet (600 mg total) by mouth every 6 (six) hours as needed. 09/27/17  Yes Kirichenko, Tatyana, PA-C  AMITRIPTYLINE HCL PO Take by mouth.    [provider]  clindamycin (CLEOCIN) 150 MG capsule Take 2 capsules (300 mg total) by mouth every 6 (six) hours. 09/27/17    Kirichenko, Tatyana, PA-C  GABAPENTIN, ONCE-DAILY, PO Take by mouth.    [provider]  HydrOXYzine Pamoate (VISTARIL PO) Take by mouth.    [provider]  MELOXICAM PO Take by mouth.    [provider]  methocarbamol (ROBAXIN) 500 MG tablet Take 1 tablet (500 mg total) by mouth 2 (two) times daily. 08/10/14   Rolland Porter, MD  Pseudoephedrine-Guaifenesin [facility] D PO) Take by mouth.    [provider]  traMADol (ULTRAM) 50 MG tablet Take 1 tablet (50 mg total) by mouth every 6 (six) hours as needed. 09/27/17   Kirichenko, Lemont Fillers, PA-C    Allergies    Amoxicillin, Eggs or egg-derived products, Penicillins, and Wheat bran  Review of Systems   Review of Systems Ten systems reviewed and are negative for acute change, except as noted in the HPI.   Physical Exam Updated Vital Signs BP 127/83   Pulse 96   Temp 98.7 F (37.1 C) (Oral)   Resp 18   Ht 5\' 6"  (1.676 m)   Wt 77.1 kg   LMP 03/21/2012   SpO2 99%   BMI 27.44 kg/m   Physical Exam Vitals and nursing note reviewed.  Constitutional:  General: She is not in acute distress.    Appearance: She is well-developed. She is ill-appearing. She is not diaphoretic.     Comments: Hot to touch   HENT:     Head: Normocephalic and atraumatic.  Eyes:     General: No scleral icterus.    Conjunctiva/sclera: Conjunctivae normal.  Cardiovascular:     Rate and Rhythm: Normal rate and regular rhythm.     Heart sounds: Normal heart sounds. No murmur heard.  No friction rub. No gallop.   Pulmonary:     Effort: Pulmonary effort is normal. No respiratory distress.     Breath sounds: Normal breath sounds.  Abdominal:     General: Bowel sounds are normal. There is no distension.     Palpations: Abdomen is soft. There is no mass.     Tenderness: There is no abdominal tenderness. There is no guarding.  Musculoskeletal:     Cervical back: Normal range of motion.  Skin:    General: Skin is warm and dry.   Neurological:     Mental Status: She is alert and oriented to person, place, and time.  Psychiatric:        Behavior: Behavior normal.     ED Results / Procedures / Treatments   Labs (all labs ordered are listed, but only abnormal results are displayed) Labs Reviewed  SARS CORONAVIRUS 2 BY RT PCR (HOSPITAL ORDER, PERFORMED IN [facility])  CBC WITH DIFFERENTIAL/PLATELET  COMPREHENSIVE METABOLIC PANEL  LIPASE, BLOOD  URINALYSIS, ROUTINE W REFLEX MICROSCOPIC    EKG None  Radiology No results found.  Procedures Procedures (including critical care time)  Medications Ordered in ED Medications - No data to display  ED Course  I have reviewed the triage vital signs and the nursing notes.  Pertinent labs & imaging results that were available during my care of the patient were reviewed by me and considered in my medical decision making (see chart for details).    MDM Rules/Calculators/A&P                         39 year old female who presents the emergency department with chief complaint of nausea vomiting diarrhea and fevers.  Recently the patient began using IV drugs again.  She states that she is not sharing needles which makes the likelihood of acute HIV or hepatitis infection low.  Also in reviewing the patient's labs she does not have any elevated liver enzymes.  The patient is however reusing her own needles making the potential for other infections possible.  On physical examination I do not hear any murmur so I have low suspicion for acute endocarditis.  We were only able to obtain 1 blood culture secondary to the pains patient's poor vascular access.  The patient does have an elevated white blood cell count.  She is not running a fever here.  Urine is negative.  Covid test is negative.  Lactic acid within normal limits, lipase within normal limits.  On repeat examination the patient's abdomen continues to be nontender.  No evidence of acute skin infections  although there are some areas of track marks on her arms with mild erythema and tenderness which could be impending cellulitic or abscess infection and I will treat the patient with doxycycline.  I reviewed images of the patient's 2 view chest x-ray which shows no evidence of pneumonia.  She does not have a cough.  I have low suspicion for underlying pneumonia.  Patient will be discharged with antinausea medication, doxycycline and return precautions.  She feels comfortable with that plan.  She has no active vomiting at this time.  Final Clinical Impression(s) / ED Diagnoses Final diagnoses:  None    Rx / DC Orders ED Discharge Orders    None       Arthor Captain, PA-C 07/18/20 0734    Vanetta Mulders, MD 07/21/20 [phone]

## 2020-07-17 NOTE — ED Notes (Signed)
Pt ambulatory with steady gait to restroom to provide urine specimen 

## 2020-07-17 NOTE — Discharge Instructions (Addendum)
Get help right away if: You have pain in your chest, neck, arm, or jaw. You feel extremely weak or you faint. You have persistent vomiting. You have vomit that is bright red or looks like black coffee grounds. You have bloody or black stools or stools that look like tar. You have a severe headache, a stiff neck, or both. You have severe pain, cramping, or bloating in your abdomen. You have difficulty breathing, or you are breathing very quickly. Your heart is beating very quickly. Your skin feels cold and clammy. You feel confused. You have signs of dehydration, such as: Dark urine, very little urine, or no urine. Cracked lips. Dry mouth. Sunken eyes. Sleepiness. Weakness. 

## 2020-07-17 NOTE — ED Triage Notes (Signed)
Fever, chills, body aches, headache, diarrhea and nausea x 3 days. She has not had a known Covid exposure. She has not had a Covid shot.

## 2020-07-22 LAB — CULTURE, BLOOD (ROUTINE X 2)
Culture: NO GROWTH
Special Requests: ADEQUATE

## 2020-07-27 ENCOUNTER — Emergency Department (HOSPITAL_BASED_OUTPATIENT_CLINIC_OR_DEPARTMENT_OTHER): Payer: Medicaid Other

## 2020-07-27 ENCOUNTER — Encounter (HOSPITAL_BASED_OUTPATIENT_CLINIC_OR_DEPARTMENT_OTHER): Payer: Self-pay | Admitting: *Deleted

## 2020-07-27 ENCOUNTER — Emergency Department (HOSPITAL_BASED_OUTPATIENT_CLINIC_OR_DEPARTMENT_OTHER)
Admission: EM | Admit: 2020-07-27 | Discharge: 2020-07-27 | Disposition: A | Discharge status: Home / Self Care | Payer: Medicaid Other | Attending: Emergency Medicine | Admitting: Emergency Medicine

## 2020-07-27 ENCOUNTER — Other Ambulatory Visit: Payer: Self-pay

## 2020-07-27 DIAGNOSIS — R109 Unspecified abdominal pain: Secondary | ICD-10-CM | POA: Diagnosis not present

## 2020-07-27 DIAGNOSIS — Z79899 Other long term (current) drug therapy: Secondary | ICD-10-CM | POA: Diagnosis not present

## 2020-07-27 DIAGNOSIS — K089 Disorder of teeth and supporting structures, unspecified: Secondary | ICD-10-CM | POA: Diagnosis not present

## 2020-07-27 DIAGNOSIS — R140 Abdominal distension (gaseous): Secondary | ICD-10-CM | POA: Diagnosis not present

## 2020-07-27 DIAGNOSIS — I10 Essential (primary) hypertension: Secondary | ICD-10-CM | POA: Insufficient documentation

## 2020-07-27 DIAGNOSIS — R197 Diarrhea, unspecified: Secondary | ICD-10-CM | POA: Diagnosis not present

## 2020-07-27 DIAGNOSIS — J45909 Unspecified asthma, uncomplicated: Secondary | ICD-10-CM | POA: Insufficient documentation

## 2020-07-27 DIAGNOSIS — F17210 Nicotine dependence, cigarettes, uncomplicated: Secondary | ICD-10-CM | POA: Insufficient documentation

## 2020-07-27 DIAGNOSIS — R112 Nausea with vomiting, unspecified: Secondary | ICD-10-CM | POA: Diagnosis present

## 2020-07-27 DIAGNOSIS — Z20822 Contact with and (suspected) exposure to covid-19: Secondary | ICD-10-CM | POA: Diagnosis not present

## 2020-07-27 LAB — CBC WITH DIFFERENTIAL/PLATELET
Abs Immature Granulocytes: 0.05 10*3/uL (ref 0.00–0.07)
Basophils Absolute: 0 10*3/uL (ref 0.0–0.1)
Basophils Relative: 0 %
Eosinophils Absolute: 0.1 10*3/uL (ref 0.0–0.5)
Eosinophils Relative: 1 %
HCT: 42.6 % (ref 36.0–46.0)
Hemoglobin: 13.9 g/dL (ref 12.0–15.0)
Immature Granulocytes: 0 %
Lymphocytes Relative: 22 %
Lymphs Abs: 3 10*3/uL (ref 0.7–4.0)
MCH: 29.5 pg (ref 26.0–34.0)
MCHC: 32.6 g/dL (ref 30.0–36.0)
MCV: 90.4 fL (ref 80.0–100.0)
Monocytes Absolute: 0.9 10*3/uL (ref 0.1–1.0)
Monocytes Relative: 6 %
Neutro Abs: 9.8 10*3/uL — ABNORMAL HIGH (ref 1.7–7.7)
Neutrophils Relative %: 71 %
Platelets: 251 10*3/uL (ref 150–400)
RBC: 4.71 MIL/uL (ref 3.87–5.11)
RDW: 12.8 % (ref 11.5–15.5)
WBC: 13.8 10*3/uL — ABNORMAL HIGH (ref 4.0–10.5)
nRBC: 0 % (ref 0.0–0.2)

## 2020-07-27 LAB — URINALYSIS, ROUTINE W REFLEX MICROSCOPIC
Bilirubin Urine: NEGATIVE
Glucose, UA: NEGATIVE mg/dL
Hgb urine dipstick: NEGATIVE
Ketones, ur: 40 mg/dL — AB
Leukocytes,Ua: NEGATIVE
Nitrite: NEGATIVE
Protein, ur: NEGATIVE mg/dL
Specific Gravity, Urine: >1.03 — ABNORMAL HIGH (ref 1.005–1.030)
pH: 5.5 (ref 5.0–8.0)

## 2020-07-27 LAB — COMPREHENSIVE METABOLIC PANEL
ALT: 114 U/L — ABNORMAL HIGH (ref 0–44)
AST: 114 U/L — ABNORMAL HIGH (ref 15–41)
Albumin: 3.9 g/dL (ref 3.5–5.0)
Alkaline Phosphatase: 46 U/L (ref 38–126)
Anion gap: 10 (ref 5–15)
BUN: 10 mg/dL (ref 6–20)
CO2: 27 mmol/L (ref 22–32)
Calcium: 9 mg/dL (ref 8.9–10.3)
Chloride: 101 mmol/L (ref 98–111)
Creatinine, Ser: 0.53 mg/dL (ref 0.44–1.00)
GFR calc Af Amer: >60 mL/min (ref >60)
GFR calc non Af Amer: >60 mL/min (ref >60)
Glucose, Bld: 80 mg/dL (ref 70–99)
Potassium: 3.6 mmol/L (ref 3.5–5.1)
Sodium: 138 mmol/L (ref 135–145)
Total Bilirubin: 0.8 mg/dL (ref 0.3–1.2)
Total Protein: 7.6 g/dL (ref 6.5–8.1)

## 2020-07-27 LAB — SARS CORONAVIRUS 2 BY RT PCR (HOSPITAL ORDER, PERFORMED IN ~~LOC~~ HOSPITAL LAB): SARS Coronavirus 2: NEGATIVE

## 2020-07-27 LAB — LACTIC ACID, PLASMA: Lactic Acid, Venous: 0.9 mmol/L (ref 0.5–1.9)

## 2020-07-27 LAB — LIPASE, BLOOD: Lipase: 16 U/L (ref 11–51)

## 2020-07-27 MED ORDER — ONDANSETRON HCL 4 MG/2ML IJ SOLN
4.0000 mg | Freq: Once | INTRAMUSCULAR | Status: AC
  Administered 2020-07-27: 4 mg via INTRAVENOUS
  Filled 2020-07-27: qty 2

## 2020-07-27 MED ORDER — LOPERAMIDE HCL 2 MG PO CAPS: 2.0000 mg | ORAL_CAPSULE | Freq: Four times a day (QID) | ORAL | 0 refills | Status: DC | PRN

## 2020-07-27 MED ORDER — FAMOTIDINE 20 MG PO TABS: 20.0000 mg | ORAL_TABLET | Freq: Two times a day (BID) | ORAL | 0 refills | Status: AC

## 2020-07-27 MED ORDER — FAMOTIDINE 20 MG PO TABS: 20.0000 mg | ORAL_TABLET | Freq: Two times a day (BID) | ORAL | 0 refills | Status: DC

## 2020-07-27 MED ORDER — SODIUM CHLORIDE 0.9 % IV BOLUS
1000.0000 mL | Freq: Once | INTRAVENOUS | Status: AC
  Administered 2020-07-27: 1000 mL via INTRAVENOUS

## 2020-07-27 MED ORDER — ONDANSETRON 4 MG PO TBDP: 4.0000 mg | ORAL_TABLET | Freq: Three times a day (TID) | ORAL | 0 refills | Status: DC | PRN

## 2020-07-27 MED ORDER — IOHEXOL 300 MG/ML  SOLN
100.0000 mL | Freq: Once | INTRAMUSCULAR | Status: AC | PRN
  Administered 2020-07-27: 100 mL via INTRAVENOUS

## 2020-07-27 NOTE — Discharge Instructions (Signed)
Please read and follow all provided instructions.  Your diagnoses today include:  1. Nausea vomiting and diarrhea     Tests performed today include:  Blood counts and electrolytes -slightly high white blood cell count  Blood tests to check liver and kidney function -slightly elevated liver test  Blood tests to check pancreas function  Urine test to look for infection -suggest dehydration  CT scan of your abdomen and pelvis -- does not show a serious bowel infection or bowel blockage  Covid test -was negative  Vital signs. See below for your results today.   Medications prescribed:   Zofran (ondansetron) - for nausea and vomiting   Pepcid (famotidine) - antihistamine  You can find this medication over-the-counter.   DO NOT exceed:   20mg  Pepcid every 12 hours   Imodium -medication for diarrhea  Take any prescribed medications only as directed.  Home care instructions:   Follow any educational materials contained in this packet.   Keep drinking plenty of fluids and use the medicine for nausea as directed.    Drink clear liquids for the next 24 hours and introduce solid foods slowly after 24 hours using the b.r.a.t. diet (Bananas, Rice, Applesauce, Toast, Yogurt).    Follow-up instructions: Please follow-up with your primary care provider in the next 2 days for further evaluation of your symptoms. If you are not feeling better in 48 hours you may have a condition that is more serious and you need re-evaluation.   Return instructions:  SEEK IMMEDIATE MEDICAL ATTENTION IF:  If you have pain that does not go away or becomes severe   A temperature above 101F develops   Repeated vomiting occurs (multiple episodes)   If you have pain that becomes localized to portions of the abdomen. The right side could possibly be appendicitis. In an adult, the left lower portion of the abdomen could be colitis or diverticulitis.   Blood is being passed in stools or vomit  (bright red or black tarry stools)   You develop chest pain, difficulty breathing, dizziness or fainting, or become confused, poorly responsive, or inconsolable (young children)  If you have any other emergent concerns regarding your health  Additional Information: Abdominal (belly) pain can be caused by many things. Your caregiver performed an examination and possibly ordered blood/urine tests and imaging (CT scan, x-rays, ultrasound). Many cases can be observed and treated at home after initial evaluation in the emergency department. Even though you are being discharged home, abdominal pain can be unpredictable. Therefore, you need a repeated exam if your pain does not resolve, returns, or worsens. Most patients with abdominal pain don't have to be admitted to the hospital or have surgery, but serious problems like appendicitis and gallbladder attacks can start out as nonspecific pain. Many abdominal conditions cannot be diagnosed in one visit, so follow-up evaluations are very important.  Your vital signs today were: BP 128/77    Pulse 68    Temp 98.6 F (37 C) (Oral)    Resp 17    Ht 5\' 6"  (1.676 m)    Wt 77.1 kg    LMP 03/21/2012    SpO2 95%    BMI 27.44 kg/m  If your blood pressure (bp) was elevated above 135/85 this visit, please have this repeated by your doctor within one month. --------------

## 2020-07-27 NOTE — ED Notes (Signed)
ED Provider at bedside. 

## 2020-07-27 NOTE — ED Notes (Signed)
Pt on monitor, cycling every 30 minutes

## 2020-07-27 NOTE — ED Triage Notes (Signed)
Pt reports N/V/D x 2 weeks. Fever today.

## 2020-07-27 NOTE — ED Notes (Addendum)
PIV in left hand (base of left thumb) flushed, slight blood return noted. Site intact, pt denies pain, no swelling noted. IV fluids started and infusing without difficulty

## 2020-07-27 NOTE — ED Notes (Signed)
Attempt x 2 by Sharia Reeve, PA to place piv via [place]. Lab work and [facility] obtained with first attempt; second BC obtained with second attempt. Both IVs inadvertently displaced while being secured.

## 2020-07-27 NOTE — ED Provider Notes (Signed)
[facility] EMERGENCY DEPARTMENT Provider Note   CSN: [id number] Arrival date & time: 07/27/20  1526     History Chief Complaint  Patient presents with  . Emesis    Jennifer Ballard is a 39 y.o. female.  Patient with history of hysterectomy, ongoing IV drug use (heroin) --presents to the emergency department for abdominal pain, nausea, vomiting, diarrhea for the past 2-1/2 weeks.  Patient was seen for the same on 07/17/2020.  Blood culture performed at that time was negative.  Patient reports severe nausea, last vomiting 2 days ago.  She has generalized abdominal pain and cramping with multiple episodes of watery, foul-smelling stools several times a day.  No blood reported.  She has had fevers reported from 75 F to 102 F every day during this time.  She denies shortness of breath but has vague pain across the middle of her chest.  She was prescribed doxycycline at her last ED visit for possible skin infection related to track marks.  She denies history of endocarditis.  No current skin abscesses.  She is injecting in the left wrist and forearm area, last use today, she states to avoid having withdrawals.  She has had 2 negative Covid test including today.          Past Medical History:  Diagnosis Date  . Asthma   . Chronic ear infection   . Chronic lower back pain   . Hearing loss in left ear   . Heroin addiction (HCC)   . Hyperlipidemia   . Hypertension   . Kidney stones   . Marijuana dependence [facility])     Patient Active Problem List   Diagnosis Date Noted  . Left ankle injury 08/03/2012    Past Surgical History:  Procedure Laterality Date  . ABDOMINAL HYSTERECTOMY    . DILATION AND CURETTAGE OF UTERUS    . TUBAL LIGATION       OB History   No obstetric history on file.     History reviewed. No pertinent family history.  Social History   Tobacco Use  . Smoking status: Current Every Day Smoker    Packs/day: 1.00    Years: 15.00    Pack years: 15.00     Types: Cigarettes  . Smokeless tobacco: Never Used  Substance Use Topics  . Alcohol use: Yes    Comment: occasional  . Drug use: Yes    Types: Marijuana    Comment: states clean x 3 mos from heroin    Home Medications Prior to Admission medications   Medication Sig Start Date End Date Taking? Authorizing Provider  diphenhydrAMINE (BENADRYL) 25 MG tablet Take 25-50 mg by mouth every 6 (six) hours as needed for allergies.    [provider]  doxycycline (VIBRAMYCIN) 100 MG capsule Take 1 capsule (100 mg total) by mouth 2 (two) times daily. One po bid x 7 days 07/17/20   Arthor Captain, PA-C  ibuprofen (ADVIL) 200 MG tablet Take 400-600 mg by mouth every 6 (six) hours as needed for moderate pain.    [provider]  ondansetron (ZOFRAN) 4 MG tablet Take 1 tablet (4 mg total) by mouth every 8 (eight) hours as needed for nausea or vomiting. 07/17/20   Arthor Captain, PA-C    Allergies    Amoxicillin, Eggs or egg-derived products, Penicillins, and Wheat bran  Review of Systems   Review of Systems  Constitutional: Positive for fatigue and fever.  HENT: Positive for dental problem. Negative for rhinorrhea  and sore throat.   Eyes: Negative for redness.  Respiratory: Negative for cough and shortness of breath.   Cardiovascular: Negative for chest pain.  Gastrointestinal: Positive for abdominal distention, abdominal pain, diarrhea, nausea and vomiting. Negative for blood in stool.  Genitourinary: Negative for dysuria, frequency, hematuria and urgency.  Musculoskeletal: Negative for back pain, myalgias and neck pain.  Skin: Negative for rash.  Neurological: Negative for headaches.    Physical Exam Updated Vital Signs BP 134/84 (BP Location: Left Arm)   Pulse 77   Temp 98.6 F (37 C) (Oral)   Resp 18   Ht 5\' 6"  (1.676 m)   Wt 77.1 kg   LMP 03/21/2012   SpO2 97%   BMI 27.44 kg/m   Physical Exam Vitals and nursing note reviewed.  Constitutional:       General: She is not in acute distress.    Appearance: She is well-developed.  HENT:     Head: Normocephalic and atraumatic.     Right Ear: External ear normal.     Left Ear: External ear normal.     Nose: No congestion or rhinorrhea.  Eyes:     Conjunctiva/sclera: Conjunctivae normal.  Cardiovascular:     Rate and Rhythm: Normal rate and regular rhythm.     Heart sounds: No murmur heard.      Comments: No tachycardia or heart murmur auscultated. Pulmonary:     Effort: No respiratory distress.     Breath sounds: No wheezing, rhonchi or rales.  Abdominal:     Palpations: Abdomen is soft.     Tenderness: There is abdominal tenderness. There is no guarding or rebound.     Comments: Generalized tenderness.  Musculoskeletal:     Cervical back: Normal range of motion and neck supple.     Right lower leg: No edema.     Left lower leg: No edema.  Skin:    General: Skin is warm and dry.     Findings: No rash.     Comments: Patient with track marks on the bilateral upper extremities.  Minimal erythema at site of recent injection, without gross abscess or cellulitis.  Neurological:     General: No focal deficit present.     Mental Status: She is alert. Mental status is at baseline.     Motor: No weakness.  Psychiatric:        Mood and Affect: Mood normal.     Comments: Patient is anxious appearing     ED Results / Procedures / Treatments   Labs (all labs ordered are listed, but only abnormal results are displayed) Labs Reviewed  CBC WITH DIFFERENTIAL/PLATELET - Abnormal; Notable for the following components:      Result Value   WBC 13.8 (*)    Neutro Abs 9.8 (*)    All other components within normal limits  COMPREHENSIVE METABOLIC PANEL - Abnormal; Notable for the following components:   AST 114 (*)    ALT 114 (*)    All other components within normal limits  URINALYSIS, ROUTINE W REFLEX MICROSCOPIC - Abnormal; Notable for the following components:   Specific Gravity, Urine  >1.030 (*)    Ketones, ur 40 (*)    All other components within normal limits  SARS CORONAVIRUS 2 BY RT PCR (HOSPITAL ORDER, PERFORMED IN [facility])  CULTURE, BLOOD (ROUTINE X 2)  CULTURE, BLOOD (ROUTINE X 2)  GASTROINTESTINAL PANEL BY PCR, STOOL (REPLACES STOOL CULTURE)  C DIFFICILE QUICK SCREEN W PCR REFLEX  LIPASE, BLOOD  LACTIC ACID, PLASMA    ED ECG REPORT   Date: 07/27/2020  Rate: 58  Rhythm: sinus bradycardia  QRS Axis: normal  Intervals: normal  ST/T Wave abnormalities: normal  Conduction Disutrbances:none  Narrative Interpretation:   Old EKG Reviewed: unchanged except slower today  I have personally reviewed the EKG tracing and agree with the computerized printout as noted.  Radiology CT ABDOMEN PELVIS W CONTRAST  Result Date: 07/27/2020 CLINICAL DATA:  Diarrhea, abdominal pain, nausea and vomiting for 2 weeks, fever EXAM: CT ABDOMEN AND PELVIS WITH CONTRAST TECHNIQUE: Multidetector CT imaging of the abdomen and pelvis was performed using the standard protocol following bolus administration of intravenous contrast. CONTRAST:  OMNIPAQUE IOHEXOL 300 MG/ML  SOLN COMPARISON:  03/20/2015 FINDINGS: Lower chest: No acute pleural or parenchymal lung disease. Hepatobiliary: Gallbladder is distended without evidence of cholelithiasis or cholecystitis. The liver is unremarkable. No biliary dilation. Pancreas: Unremarkable. No pancreatic ductal dilatation or surrounding inflammatory changes. Spleen: Normal in size without focal abnormality. Adrenals/Urinary Tract: Adrenal glands are unremarkable. Kidneys are normal, without renal calculi, focal lesion, or hydronephrosis. Bladder is unremarkable. Stomach/Bowel: No bowel obstruction or ileus. Normal appendix right lower quadrant. No bowel wall thickening or inflammatory change. Vascular/Lymphatic: No significant vascular findings are present. No enlarged abdominal or pelvic lymph nodes. Reproductive: Status post  hysterectomy. No adnexal masses. Other: No free fluid or free gas.  No abdominal wall hernia. Musculoskeletal: No acute or destructive bony lesions. Reconstructed images demonstrate no additional findings. IMPRESSION: 1. Nonspecific distention of the gallbladder, with no CT evidence of cholelithiasis or cholecystitis. 2. Normal appendix right lower quadrant. 3. Otherwise no acute intra-abdominal or intrapelvic process. Electronically Signed   By: Sharlet Salina M.D.   On: 07/27/2020 22:00    Procedures Procedures (including critical care time)  Medications Ordered in ED Medications  sodium chloride 0.9 % bolus 1,000 mL (1,000 mLs Intravenous New Bag/Given 07/27/20 2051)  ondansetron (ZOFRAN) injection 4 mg (4 mg Intravenous Given 07/27/20 2052)  iohexol (OMNIPAQUE) 300 MG/ML solution 100 mL (100 mLs Intravenous Contrast Given 07/27/20 2143)    ED Course  I have reviewed the triage vital signs and the nursing notes.  Pertinent labs & imaging results that were available during my care of the patient were reviewed by me and considered in my medical decision making (see chart for details).  Patient seen and examined.  Patient appears well and nontoxic.  Given ongoing symptoms, greater than 2 weeks, including fevers, work-up ordered as above.  I have lower concern for endocarditis at this point, however blood cultures have been ordered.  Will CT abdomen and pelvis and check appropriate lab work.  Vital signs are reassuring.  Will give IV fluids and Zofran.  If patient is able to have a bowel movement, will check a GI pathogen panel and C. difficile.  Vital signs reviewed and are as follows: BP 134/84 (BP Location: Left Arm)   Pulse 77   Temp 98.6 F (37 C) (Oral)   Resp 18   Ht 5\' 6"  (1.676 m)   Wt 77.1 kg   LMP 03/21/2012   SpO2 97%   BMI 27.44 kg/m   EMERGENCY DEPARTMENT  03/23/2012 GUIDANCE EXAM Emergency Ultrasound:  US Guidance for Needle Guidance  INDICATIONS: Difficult vascular  access Linear probe used in real-time to visualize location of needle entry through skin.   PERFORMED BY: Myself IMAGES ARCHIVED?: Yes LIMITATIONS: None VIEWS USED: Transverse INTERPRETATION: Needle visualized within vein, Right arm and Needle  gauge 20   US guived IV placed x 2 but both slipped out after obtaining blood for labs.   11:23 PM patient hydrated.  Lab work is overall reassuring.  Shows some signs of dehydration.  CT of the abdomen pelvis without acute findings.  Patient has been unable to have a bowel movement while in the ED during 8-hour stay.  No further vomiting.  Discussed results with patient at bedside.  We will treat symptoms.  Patient will be given a prescription for Zofran, Pepcid, and Imodium.  She is encouraged to follow-up with her PCP in the next 48 hours for recheck.  Discussed that she may warrant stool testing if symptoms persist.  The patient was urged to return to the Emergency Department immediately with worsening of current symptoms, worsening abdominal pain, persistent vomiting, blood noted in stools, fever, or any other concerns. The patient verbalized understanding.      MDM Rules/Calculators/A&P                          Patient presents today with over 2 weeks of abdominal pain, nausea vomiting and diarrhea in setting of ongoing IV drug use.  Patient also reports some chest pain.  Her lab work today demonstrates a mild elevation in white blood cell count.  Mild transaminitis as well.  She had a CT of the abdomen pelvis without any emergent findings.  UA without signs of infection, suggests dehydration.  Patient was given IV fluids here.  Additional blood cultures were drawn.  No murmur or fever here to suggest bacteremia from endocarditis.  No indications for admission today.  Vital signs are reassuring.  Patient's vomiting is controlled.  She has not been able to give a stool sample has not had any diarrhea in 8 hours.  Plan for discharged home with close  follow-up.  Strict return instructions as above.  Final Clinical Impression(s) / ED Diagnoses Final diagnoses:  Nausea vomiting and diarrhea    Rx / DC Orders ED Discharge Orders         Ordered    ondansetron (ZOFRAN ODT) 4 MG disintegrating tablet  Every 8 hours PRN        07/27/20 2317    loperamide (IMODIUM) 2 MG capsule  4 times daily PRN        07/27/20 2317    famotidine (PEPCID) 20 MG tablet  2 times daily        07/27/20 2317           Renne CriglerGeiple, Paolo Okane, PA-C 07/27/20 2327    Jacalyn LefevreHaviland, Julie, MD 07/27/20 2342

## 2020-07-27 NOTE — ED Notes (Signed)
Jennifer Ballard ED Provider at bedside attempt [place] IV placement

## 2020-08-01 LAB — CULTURE, BLOOD (ROUTINE X 2)
Culture: NO GROWTH
Culture: NO GROWTH
Special Requests: ADEQUATE
Special Requests: ADEQUATE

## 2020-08-11 ENCOUNTER — Emergency Department (HOSPITAL_BASED_OUTPATIENT_CLINIC_OR_DEPARTMENT_OTHER)
Admission: EM | Admit: 2020-08-11 | Discharge: 2020-08-11 | Disposition: A | Discharge status: Home / Self Care | Payer: Medicaid Other | Attending: Emergency Medicine | Admitting: Emergency Medicine

## 2020-08-11 ENCOUNTER — Other Ambulatory Visit: Payer: Self-pay

## 2020-08-11 ENCOUNTER — Encounter (HOSPITAL_BASED_OUTPATIENT_CLINIC_OR_DEPARTMENT_OTHER): Payer: Self-pay

## 2020-08-11 DIAGNOSIS — J45909 Unspecified asthma, uncomplicated: Secondary | ICD-10-CM | POA: Diagnosis not present

## 2020-08-11 DIAGNOSIS — B370 Candidal stomatitis: Secondary | ICD-10-CM

## 2020-08-11 DIAGNOSIS — F17210 Nicotine dependence, cigarettes, uncomplicated: Secondary | ICD-10-CM | POA: Diagnosis not present

## 2020-08-11 DIAGNOSIS — J029 Acute pharyngitis, unspecified: Secondary | ICD-10-CM

## 2020-08-11 DIAGNOSIS — I10 Essential (primary) hypertension: Secondary | ICD-10-CM | POA: Diagnosis not present

## 2020-08-11 LAB — GROUP A STREP BY PCR: Group A Strep by PCR: NOT DETECTED

## 2020-08-11 MED ORDER — NYSTATIN 100000 UNIT/ML MT SUSP: 500000.0000 [IU] | Freq: Four times a day (QID) | OROMUCOSAL | 0 refills | Status: AC

## 2020-08-11 MED FILL — NYSTATIN 100,000 UNITS/ML S: 100000 | 7 days supply | Qty: 140 | Fill #0

## 2020-08-11 NOTE — ED Provider Notes (Signed)
[facility] EMERGENCY DEPARTMENT Provider Note   CSN: [id number] Arrival date & time: 08/11/20  1047     History Chief Complaint  Patient presents with  . Jennifer Ballard is a 39 y.o. female.  Patient with history of IV drug use presents the emergency department for sore throat.  Patient believes that she has seen thrush on her tongue and back of her throat for the past couple of days.  She states that she was prescribed antibiotics for a dental infection which he has recently been taking.  No fevers or vomiting.  No difficulty breathing or swallowing, although swallowing is painful.  Patient seen by myself over 2 weeks ago for vomiting and diarrhea.        Past Medical History:  Diagnosis Date  . Asthma   . Chronic ear infection   . Chronic lower back pain   . Hearing loss in left ear   . Heroin addiction (HCC)   . Hyperlipidemia   . Hypertension   . Kidney stones   . Marijuana dependence [facility])     Patient Active Problem List   Diagnosis Date Noted  . Left ankle injury 08/03/2012    Past Surgical History:  Procedure Laterality Date  . ABDOMINAL HYSTERECTOMY    . DILATION AND CURETTAGE OF UTERUS    . TUBAL LIGATION       OB History   No obstetric history on file.     No family history on file.  Social History   Tobacco Use  . Smoking status: Current Every Day Smoker    Packs/day: 1.00    Years: 15.00    Pack years: 15.00    Types: Cigarettes  . Smokeless tobacco: Never Used  . Tobacco comment: none x 1 month as of 08/11/2020  Vaping Use  . Vaping Use: Never used  Substance Use Topics  . Alcohol use: Not Currently  . Drug use: Yes    Types: IV, Marijuana    Home Medications Prior to Admission medications   Medication Sig Start Date End Date Taking? Authorizing Provider  diphenhydrAMINE (BENADRYL) 25 MG tablet Take 25-50 mg by mouth every 6 (six) hours as needed for allergies.    [provider]  doxycycline  (VIBRAMYCIN) 100 MG capsule Take 1 capsule (100 mg total) by mouth 2 (two) times daily. One po bid x 7 days 07/17/20   Arthor Captain, PA-C  famotidine (PEPCID) 20 MG tablet Take 1 tablet (20 mg total) by mouth 2 (two) times daily. 07/27/20   Renne Crigler, PA-C  ibuprofen (ADVIL) 200 MG tablet Take 400-600 mg by mouth every 6 (six) hours as needed for moderate pain.    [provider]  loperamide (IMODIUM) 2 MG capsule Take 1 capsule (2 mg total) by mouth 4 (four) times daily as needed for diarrhea or loose stools. 07/27/20   Renne Crigler, PA-C  ondansetron (ZOFRAN ODT) 4 MG disintegrating tablet Take 1 tablet (4 mg total) by mouth every 8 (eight) hours as needed for nausea or vomiting. 07/27/20   Renne Crigler, PA-C  ondansetron (ZOFRAN) 4 MG tablet Take 1 tablet (4 mg total) by mouth every 8 (eight) hours as needed for nausea or vomiting. 07/17/20   Arthor Captain, PA-C    Allergies    Amoxicillin, Eggs or egg-derived products, Penicillins, Wheat bran, and Doxycycline  Review of Systems   Review of Systems  Constitutional: Negative for fever.  HENT: Positive for sore throat and  trouble swallowing.   Respiratory: Negative for shortness of breath.   Cardiovascular: Negative for chest pain.  Gastrointestinal: Negative for nausea and vomiting.    Physical Exam Updated Vital Signs BP 139/78 (BP Location: Left Arm)   Pulse 93   Temp 99.4 F (37.4 C) (Oral)   Resp 16   Ht 5\' 6"  (1.676 m)   Wt 78 kg   LMP 03/21/2012   SpO2 97%   BMI 27.76 kg/m   Physical Exam Vitals and nursing note reviewed.  Constitutional:      Appearance: She is well-developed.  HENT:     Head: Normocephalic and atraumatic.     Right Ear: Tympanic membrane, ear canal and external ear normal.     Left Ear: Tympanic membrane, ear canal and external ear normal.     Nose: No congestion or rhinorrhea.     Mouth/Throat:     Mouth: Mucous membranes are moist.     Comments: There is a slight white  coating on her tongue, pharynx is clear without exudate.  Eyes:     Conjunctiva/sclera: Conjunctivae normal.  Pulmonary:     Effort: No respiratory distress.  Musculoskeletal:     Cervical back: Normal range of motion and neck supple.  Skin:    General: Skin is warm and dry.  Neurological:     Mental Status: She is alert.     ED Results / Procedures / Treatments   Labs (all labs ordered are listed, but only abnormal results are displayed) Labs Reviewed  GROUP A STREP BY PCR    EKG None  Radiology No results found.  Procedures Procedures (including critical care time)  Medications Ordered in ED Medications - No data to display  ED Course  I have reviewed the triage vital signs and the nursing notes.  Pertinent labs & imaging results that were available during my care of the patient were reviewed by me and considered in my medical decision making (see chart for details).  Patient seen and examined. Strep test ordered. If neg, will rx nystatin swish and swallow.   Vital signs reviewed and are as follows: BP 139/78 (BP Location: Left Arm)   Pulse 93   Temp 99.4 F (37.4 C) (Oral)   Resp 16   Ht 5\' 6"  (1.676 m)   Wt 78 kg   LMP 03/21/2012   SpO2 97%   BMI 27.76 kg/m   Reviewed labs from previous visit.  Blood cultures with no growth.  Strep test negative.  Patient be discharged home with nystatin as discussed.  She has completed course of antibiotics for her dental infection which she states was an abscess.  She is working on finding routine dental care.  Patient encouraged to return with worsening or other concerns.     MDM Rules/Calculators/A&P                          Patient with sore throat and possible thrush after recent course of antibiotics.  Strep test is negative.  Patient is afebrile and looks well.  She is able to swallow.  No neck swelling or signs of deep space abscess.  She has full range of motion of her neck without difficulty.  Will give  trial of nystatin.   Final Clinical Impression(s) / ED Diagnoses Final diagnoses:  Sore throat  Oropharyngeal candidiasis    Rx / DC Orders ED Discharge Orders         Ordered  nystatin (MYCOSTATIN) 100000 UNIT/ML suspension  4 times daily        08/11/20 1453           Renne Crigler, PA-C 08/11/20 1511    Pollyann Savoy, MD 08/11/20 1711

## 2020-08-11 NOTE — ED Triage Notes (Signed)
Pt states she thinks she has thrush in her mouth-c/o "white in my mouth"-states she has been taking abx "for my teeth"-NAD-steady gait

## 2020-08-11 NOTE — Discharge Instructions (Signed)
Please read and follow all provided instructions.  Your diagnoses today include:  1. Sore throat   2. Oropharyngeal candidiasis     Tests performed today include:  Strep test: was negative for strep throat  Vital signs. See below for your results today.   Medications prescribed:   Nystatin - medication to try for thrush  Home care instructions:  Please read the educational materials provided and follow any instructions contained in this packet.  Follow-up instructions: Please follow-up with your primary care provider as needed for further evaluation of your symptoms.  Return instructions:   Please return to the Emergency Department if you experience worsening symptoms.   Return if you have worsening problems swallowing, your neck becomes swollen, you cannot swallow your saliva or your voice becomes muffled.   Return with high persistent fever, persistent vomiting, or if you have trouble breathing.   Please return if you have any other emergent concerns.  Additional Information:  Your vital signs today were: BP 139/78 (BP Location: Left Arm)    Pulse 93    Temp 99.4 F (37.4 C) (Oral)    Resp 16    Ht 5\' 6"  (1.676 m)    Wt 78 kg    LMP 03/21/2012    SpO2 97%    BMI 27.76 kg/m  If your blood pressure (BP) was elevated above 135/85 this visit, please have this repeated by your doctor within one month. --------------

## 2020-09-28 ENCOUNTER — Emergency Department (HOSPITAL_BASED_OUTPATIENT_CLINIC_OR_DEPARTMENT_OTHER): Payer: Medicaid Other

## 2020-09-28 ENCOUNTER — Other Ambulatory Visit: Payer: Self-pay

## 2020-09-28 ENCOUNTER — Encounter (HOSPITAL_BASED_OUTPATIENT_CLINIC_OR_DEPARTMENT_OTHER): Payer: Self-pay | Admitting: Emergency Medicine

## 2020-09-28 ENCOUNTER — Emergency Department (HOSPITAL_BASED_OUTPATIENT_CLINIC_OR_DEPARTMENT_OTHER)
Admission: EM | Admit: 2020-09-28 | Discharge: 2020-09-28 | Disposition: A | Discharge status: Home / Self Care | Payer: Medicaid Other | Attending: Emergency Medicine | Admitting: Emergency Medicine

## 2020-09-28 DIAGNOSIS — R1032 Left lower quadrant pain: Secondary | ICD-10-CM | POA: Insufficient documentation

## 2020-09-28 DIAGNOSIS — J45909 Unspecified asthma, uncomplicated: Secondary | ICD-10-CM | POA: Insufficient documentation

## 2020-09-28 DIAGNOSIS — R112 Nausea with vomiting, unspecified: Secondary | ICD-10-CM | POA: Diagnosis present

## 2020-09-28 DIAGNOSIS — I10 Essential (primary) hypertension: Secondary | ICD-10-CM | POA: Insufficient documentation

## 2020-09-28 DIAGNOSIS — F17210 Nicotine dependence, cigarettes, uncomplicated: Secondary | ICD-10-CM | POA: Diagnosis not present

## 2020-09-28 DIAGNOSIS — F1123 Opioid dependence with withdrawal: Secondary | ICD-10-CM | POA: Diagnosis not present

## 2020-09-28 DIAGNOSIS — F1193 Opioid use, unspecified with withdrawal: Secondary | ICD-10-CM

## 2020-09-28 DIAGNOSIS — R197 Diarrhea, unspecified: Secondary | ICD-10-CM | POA: Diagnosis not present

## 2020-09-28 DIAGNOSIS — Z20822 Contact with and (suspected) exposure to covid-19: Secondary | ICD-10-CM | POA: Diagnosis not present

## 2020-09-28 LAB — LACTIC ACID, PLASMA
Lactic Acid, Venous: 2.5 mmol/L (ref 0.5–1.9)
Lactic Acid, Venous: 1.5 mmol/L (ref 0.5–1.9)

## 2020-09-28 LAB — URINALYSIS, ROUTINE W REFLEX MICROSCOPIC
Bilirubin Urine: NEGATIVE
Glucose, UA: NEGATIVE mg/dL
Hgb urine dipstick: NEGATIVE
Ketones, ur: NEGATIVE mg/dL
Leukocytes,Ua: NEGATIVE
Nitrite: NEGATIVE
Protein, ur: NEGATIVE mg/dL
Specific Gravity, Urine: <1.005 — ABNORMAL LOW (ref 1.005–1.030)
pH: 8.5 — ABNORMAL HIGH (ref 5.0–8.0)

## 2020-09-28 LAB — CBC WITH DIFFERENTIAL/PLATELET
Abs Immature Granulocytes: 0.02 10*3/uL (ref 0.00–0.07)
Basophils Absolute: 0 10*3/uL (ref 0.0–0.1)
Basophils Relative: 0 %
Eosinophils Absolute: 0.1 10*3/uL (ref 0.0–0.5)
Eosinophils Relative: 1 %
HCT: 50.2 % — ABNORMAL HIGH (ref 36.0–46.0)
Hemoglobin: 17 g/dL — ABNORMAL HIGH (ref 12.0–15.0)
Immature Granulocytes: 0 %
Lymphocytes Relative: 19 %
Lymphs Abs: 1.5 10*3/uL (ref 0.7–4.0)
MCH: 30.6 pg (ref 26.0–34.0)
MCHC: 33.9 g/dL (ref 30.0–36.0)
MCV: 90.5 fL (ref 80.0–100.0)
Monocytes Absolute: 0.3 10*3/uL (ref 0.1–1.0)
Monocytes Relative: 4 %
Neutro Abs: 6.3 10*3/uL (ref 1.7–7.7)
Neutrophils Relative %: 76 %
Platelets: 220 10*3/uL (ref 150–400)
RBC: 5.55 MIL/uL — ABNORMAL HIGH (ref 3.87–5.11)
RDW: 13 % (ref 11.5–15.5)
WBC: 8.3 10*3/uL (ref 4.0–10.5)
nRBC: 0 % (ref 0.0–0.2)

## 2020-09-28 LAB — RESPIRATORY PANEL BY RT PCR (FLU A&B, COVID)
Influenza A by PCR: NEGATIVE
Influenza B by PCR: NEGATIVE
SARS Coronavirus 2 by RT PCR: NEGATIVE

## 2020-09-28 LAB — COMPREHENSIVE METABOLIC PANEL
ALT: 59 U/L — ABNORMAL HIGH (ref 0–44)
AST: 46 U/L — ABNORMAL HIGH (ref 15–41)
Albumin: 4.2 g/dL (ref 3.5–5.0)
Alkaline Phosphatase: 48 U/L (ref 38–126)
Anion gap: 13 (ref 5–15)
BUN: 10 mg/dL (ref 6–20)
CO2: 22 mmol/L (ref 22–32)
Calcium: 9.7 mg/dL (ref 8.9–10.3)
Chloride: 100 mmol/L (ref 98–111)
Creatinine, Ser: 0.6 mg/dL (ref 0.44–1.00)
GFR, Estimated: >60 mL/min (ref >60)
Glucose, Bld: 115 mg/dL — ABNORMAL HIGH (ref 70–99)
Potassium: 3.9 mmol/L (ref 3.5–5.1)
Sodium: 135 mmol/L (ref 135–145)
Total Bilirubin: 0.3 mg/dL (ref 0.3–1.2)
Total Protein: 8.9 g/dL — ABNORMAL HIGH (ref 6.5–8.1)

## 2020-09-28 LAB — LIPASE, BLOOD: Lipase: 29 U/L (ref 11–51)

## 2020-09-28 LAB — CBG MONITORING, ED: Glucose-Capillary: 122 mg/dL — ABNORMAL HIGH (ref 70–99)

## 2020-09-28 MED ORDER — LOPERAMIDE HCL 2 MG PO CAPS: 2.0000 mg | ORAL_CAPSULE | Freq: Four times a day (QID) | ORAL | 0 refills | Status: AC | PRN

## 2020-09-28 MED ORDER — PROMETHAZINE HCL 25 MG/ML IJ SOLN
12.5000 mg | Freq: Once | INTRAMUSCULAR | Status: AC
  Administered 2020-09-28: 12.5 mg via INTRAVENOUS
  Filled 2020-09-28: qty 1

## 2020-09-28 MED ORDER — KETOROLAC TROMETHAMINE 30 MG/ML IJ SOLN
30.0000 mg | Freq: Once | INTRAMUSCULAR | Status: AC
  Administered 2020-09-28: 30 mg via INTRAVENOUS
  Filled 2020-09-28: qty 1

## 2020-09-28 MED ORDER — KETOROLAC TROMETHAMINE 15 MG/ML IJ SOLN
15.0000 mg | Freq: Once | INTRAMUSCULAR | Status: AC
  Administered 2020-09-28: 15 mg via INTRAVENOUS
  Filled 2020-09-28: qty 1

## 2020-09-28 MED ORDER — ACETAMINOPHEN 500 MG PO TABS
1000.0000 mg | ORAL_TABLET | Freq: Once | ORAL | Status: AC
  Administered 2020-09-28: 1000 mg via ORAL
  Filled 2020-09-28: qty 2

## 2020-09-28 MED ORDER — IBUPROFEN 600 MG PO TABS: 600.0000 mg | ORAL_TABLET | Freq: Four times a day (QID) | ORAL | 0 refills | Status: AC | PRN

## 2020-09-28 MED ORDER — METHOCARBAMOL 500 MG PO TABS
1000.0000 mg | ORAL_TABLET | Freq: Once | ORAL | Status: AC
  Administered 2020-09-28: 1000 mg via ORAL
  Filled 2020-09-28: qty 2

## 2020-09-28 MED ORDER — SODIUM CHLORIDE 0.9 % IV BOLUS
1000.0000 mL | Freq: Once | INTRAVENOUS | Status: AC
  Administered 2020-09-28: 1000 mL via INTRAVENOUS

## 2020-09-28 MED ORDER — PROMETHAZINE HCL 25 MG/ML IJ SOLN: 12.5000 mg | Freq: Once | INTRAMUSCULAR | Status: DC

## 2020-09-28 MED ORDER — IOHEXOL 300 MG/ML  SOLN
100.0000 mL | Freq: Once | INTRAMUSCULAR | Status: AC | PRN
  Administered 2020-09-28: 100 mL via INTRAVENOUS

## 2020-09-28 MED ORDER — LOPERAMIDE HCL 2 MG PO CAPS
4.0000 mg | ORAL_CAPSULE | Freq: Once | ORAL | Status: AC
  Administered 2020-09-28: 4 mg via ORAL
  Filled 2020-09-28: qty 2

## 2020-09-28 MED ORDER — METHOCARBAMOL 500 MG PO TABS: 1000.0000 mg | ORAL_TABLET | Freq: Four times a day (QID) | ORAL | 0 refills | Status: DC | PRN

## 2020-09-28 MED ORDER — ONDANSETRON 4 MG PO TBDP: 4.0000 mg | ORAL_TABLET | Freq: Three times a day (TID) | ORAL | 0 refills | Status: DC | PRN

## 2020-09-28 NOTE — ED Notes (Signed)
Patient transported to CT 

## 2020-09-28 NOTE — ED Notes (Signed)
Lactic Acid 2.5, ED MD informed

## 2020-09-28 NOTE — ED Notes (Signed)
ED Provider at bedside. 

## 2020-09-28 NOTE — ED Triage Notes (Addendum)
Arrived vis [facility] with c/o of n/v x 5 hrs. Had first dose of suboxone and Protonix at 0400. BP 120/pal. P 90, R 18, T98.3, O2Sat 98%RA. Last used heroin on Thursday .

## 2020-09-28 NOTE — ED Provider Notes (Signed)
[facility] EMERGENCY DEPARTMENT Provider Note   CSN: [id number] Arrival date & time: 09/28/20  2952     History Chief Complaint  Patient presents with  . Vomiting    Jennifer Ballard is a 39 y.o. female.  HPI     39yo female with history of hypertension, hyperlipidemia, heroin addiction, presents with concern for nausea, vomiting and abdominal pain.  Reports woke up at 2AM feeling sick, took her first dose of suboxone and protonix at 4AM.  Was prescribed by pain management to help with detox--told to take after began having symptoms or at least 24hr after last use. Last used heroin 48hr ago.  Does note chills. No known fevers. Has had abdominal pain over the last few weeks however worsened now. Diarrhea began this AM. Has had chronic dental and neck pain. No cough. Congestion but notes allergy hx. No chest pain or dyspnea. No urinary symptoms. Hx hysterectomy.     Past Medical History:  Diagnosis Date  . Asthma   . Chronic ear infection   . Chronic lower back pain   . Hearing loss in left ear   . Heroin addiction (HCC)   . Hyperlipidemia   . Hypertension   . Kidney stones   . Marijuana dependence [facility])     Patient Active Problem List   Diagnosis Date Noted  . Left ankle injury 08/03/2012    Past Surgical History:  Procedure Laterality Date  . ABDOMINAL HYSTERECTOMY    . DILATION AND CURETTAGE OF UTERUS    . TUBAL LIGATION       OB History   No obstetric history on file.     No family history on file.  Social History   Tobacco Use  . Smoking status: Current Every Day Smoker    Packs/day: 1.00    Years: 15.00    Pack years: 15.00    Types: Cigarettes  . Smokeless tobacco: Never Used  Vaping Use  . Vaping Use: Never used  Substance Use Topics  . Alcohol use: Not Currently  . Drug use: Yes    Types: IV, Marijuana    Comment: Heroin last used on 10/28    Home Medications Prior to Admission medications   Medication Sig Start Date End  Date Taking? Authorizing Provider  SUBOXONE 8-2 MG FILM Place under the tongue. 09/25/20  Yes [provider]  pantoprazole (PROTONIX) 40 MG tablet Take 40 mg by mouth daily. 09/16/20 09/28/20 Yes [provider]  diphenhydrAMINE (BENADRYL) 25 MG tablet Take 25-50 mg by mouth every 6 (six) hours as needed for allergies.    [provider]  famotidine (PEPCID) 20 MG tablet Take 1 tablet (20 mg total) by mouth 2 (two) times daily. 07/27/20   Renne Crigler, PA-C  ibuprofen (ADVIL) 600 MG tablet Take 1 tablet (600 mg total) by mouth every 6 (six) hours as needed (with food). 09/28/20   Alvira Monday, MD  loperamide (IMODIUM) 2 MG capsule Take 1 capsule (2 mg total) by mouth 4 (four) times daily as needed for diarrhea or loose stools. 09/28/20   Alvira Monday, MD  methocarbamol (ROBAXIN) 500 MG tablet Take 2 tablets (1,000 mg total) by mouth every 6 (six) hours as needed for muscle spasms. 09/28/20   Alvira Monday, MD  ondansetron (ZOFRAN ODT) 4 MG disintegrating tablet Take 1 tablet (4 mg total) by mouth every 8 (eight) hours as needed for nausea or vomiting. 09/28/20   Alvira Monday, MD    Allergies  Amoxicillin, Eggs or egg-derived products, Penicillins, Wheat bran, and Doxycycline  Review of Systems   Review of Systems  Constitutional: Positive for appetite change and chills. Negative for fever.  HENT: Positive for congestion. Negative for sore throat.   Eyes: Negative for visual disturbance.  Respiratory: Negative for cough and shortness of breath.   Cardiovascular: Negative for chest pain.  Gastrointestinal: Positive for abdominal pain, diarrhea, nausea and vomiting.  Genitourinary: Negative for difficulty urinating and dysuria.  Musculoskeletal: Positive for neck pain. Negative for back pain.  Skin: Negative for rash.  Neurological: Negative for syncope and headaches.    Physical Exam Updated Vital Signs BP 125/86   Pulse 99   Temp 98.8 F  (37.1 C) (Oral)   Resp 18   Ht 5\' 6"  (1.676 m)   Wt 80.7 kg   LMP 03/21/2012   SpO2 98%   BMI 28.73 kg/m   Physical Exam Vitals and nursing note reviewed.  Constitutional:      General: She is not in acute distress.    Appearance: She is well-developed. She is ill-appearing. She is not diaphoretic.  HENT:     Head: Normocephalic and atraumatic.  Eyes:     Conjunctiva/sclera: Conjunctivae normal.  Cardiovascular:     Rate and Rhythm: Normal rate and regular rhythm.     Heart sounds: Normal heart sounds. No murmur heard.  No friction rub. No gallop.   Pulmonary:     Effort: Pulmonary effort is normal. No respiratory distress.     Breath sounds: Normal breath sounds. No wheezing or rales.  Abdominal:     General: There is no distension.     Palpations: Abdomen is soft.     Tenderness: There is abdominal tenderness (LUQ and LLQ). There is no guarding.  Musculoskeletal:        General: No tenderness.     Cervical back: Normal range of motion.  Skin:    General: Skin is warm and dry.     Findings: No erythema or rash.  Neurological:     Mental Status: She is alert and oriented to person, place, and time.     ED Results / Procedures / Treatments   Labs (all labs ordered are listed, but only abnormal results are displayed) Labs Reviewed  CBC WITH DIFFERENTIAL/PLATELET - Abnormal; Notable for the following components:      Result Value   RBC 5.55 (*)    Hemoglobin 17.0 (*)    HCT 50.2 (*)    All other components within normal limits  COMPREHENSIVE METABOLIC PANEL - Abnormal; Notable for the following components:   Glucose, Bld 115 (*)    Total Protein 8.9 (*)    AST 46 (*)    ALT 59 (*)    All other components within normal limits  URINALYSIS, ROUTINE W REFLEX MICROSCOPIC - Abnormal; Notable for the following components:   APPearance HAZY (*)    Specific Gravity, Urine <1.005 (*)    pH 8.5 (*)    All other components within normal limits  LACTIC ACID, PLASMA -  Abnormal; Notable for the following components:   Lactic Acid, Venous 2.5 (*)    All other components within normal limits  CBG MONITORING, ED - Abnormal; Notable for the following components:   Glucose-Capillary 122 (*)    All other components within normal limits  RESPIRATORY PANEL BY RT PCR (FLU A&B, COVID)  URINE CULTURE  CULTURE, BLOOD (ROUTINE X 2)  CULTURE, BLOOD (ROUTINE X 2)  LIPASE,  BLOOD  LACTIC ACID, PLASMA    EKG EKG Interpretation  Date/Time:  "Sunday September 28 2020 08:18:34 EDT Ventricular Rate:  94 PR Interval:    QRS Duration: 92 QT Interval:  412 QTC Calculation: 516 R Axis:   40 Text Interpretation: Sinus rhythm Low voltage, precordial leads RSR' in V1 or V2, right VCD or RVH Prolonged QT interval No significant change since last tracing Confirmed by Sheryle Vice ([id number]) on 09/28/2020 8:40:46 AM   Radiology CT ABDOMEN PELVIS W CONTRAST  Result Date: 09/28/2020 CLINICAL DATA:  Acute left-sided abdominal pain. EXAM: CT ABDOMEN AND PELVIS WITH CONTRAST TECHNIQUE: Multidetector CT imaging of the abdomen and pelvis was performed using the standard protocol following bolus administration of intravenous contrast. CONTRAST:  100mL OMNIPAQUE IOHEXOL 300 MG/ML  SOLN COMPARISON:  07/27/2020 FINDINGS: Lower chest: Clear lung bases.  Heart normal in size. Hepatobiliary: No focal liver abnormality is seen. No gallstones, gallbladder wall thickening, or biliary dilatation. Pancreas: Unremarkable. No pancreatic ductal dilatation or surrounding inflammatory changes. Spleen: Normal in size without focal abnormality. Adrenals/Urinary Tract: No adrenal masses. Kidneys normal in size, orientation and position with symmetric enhancement. 7 mm low-density mass in the right kidney midpole consistent with a cyst. No other renal masses, no stones and no hydronephrosis. Normal ureters. Normal bladder. Stomach/Bowel: Normal stomach and small bowel. Colon is normal in caliber. No wall  thickening or inflammation. Specifically, no evidence of diverticulitis. Normal appendix visualized. Vascular/Lymphatic: Minor aortic atherosclerosis. No other vascular abnormality. No enlarged lymph nodes. Reproductive: Status post hysterectomy. No adnexal masses. Other: No abdominal wall hernia or abnormality. No abdominopelvic ascites. Musculoskeletal: No acute or significant osseous findings. IMPRESSION: 1. No acute findings. No findings to account for left-sided abdominal pain. No evidence of diverticulitis or other bowel inflammatory process. 2. Minor aortic atherosclerosis.  No other abnormalities. Electronically Signed   By: David  Ormond M.D.   On: 09/28/2020 10:20    Procedures Procedures (including critical care time)  Medications Ordered in ED Medications  sodium chloride 0.9 % bolus 1,000 mL (0 mLs Intravenous Stopped 09/28/20 1032)  ketorolac (TORADOL) 30 MG/ML injection 30 mg (30 mg Intravenous Given 09/28/20 0917)  iohexol (OMNIPAQUE) 300 MG/ML solution 100 mL (100 mLs Intravenous Contrast Given 09/28/20 1000)  sodium chloride 0.9 % bolus 1,000 mL ( Intravenous Stopped 09/28/20 1242)  promethazine (PHENERGAN) injection 12.5 mg (12.5 mg Intravenous Given 09/28/20 1418)  methocarbamol (ROBAXIN) tablet 1,000 mg (1,000 mg Oral Given 09/28/20 1414)  loperamide (IMODIUM) capsule 4 mg (4 mg Oral Given 09/28/20 1414)  ketorolac (TORADOL) 15 MG/ML injection 15 mg (15 mg Intravenous Given 09/28/20 1417)  acetaminophen (TYLENOL) tablet 1,000 mg (1,000 mg Oral Given 09/28/20 1414)    ED Course  I have reviewed the triage vital signs and the nursing notes.  Pertinent labs & imaging results that were available during my care of the patient were reviewed by me and considered in my medical decision making (see chart for details).    MDM Rules/Calculators/A&P                          38" yo female with history of hypertension, hyperlipidemia, heroin addiction, presents with concern for  nausea, vomiting and abdominal pain.    DDx includes appendicitis, pancreatitis, cholecystitis, pyelonephritis, nephrolithiasis, diverticulitis, septic emboli/bacteremia, opiate withdrawal, suboxone side effect.  Labs show no significant abnormalities. CT abdomen pelvis done given left sided abdominal pain shows no septic emboli, no diverticulitis and no other acute findings.  UA without signs of infection. Has hx of hysterectomy.  Blood cx drawn given chills and hx of IVDU although suspect chills secondary to withdrawal symptoms.  Presentation likely secondary to opiate withdrawal and or suboxone side effects.  Given IV fluids and nausea medicine with improvement of symptoms. Able to tolerate po.  Given rx for robaxin, imodium, zofran, ibuprofen and resources. Patient discharged in stable condition with understanding of reasons to return.       Final Clinical Impression(s) / ED Diagnoses Final diagnoses:  Opiate withdrawal (HCC)  Nausea vomiting and diarrhea    Rx / DC Orders ED Discharge Orders         Ordered    loperamide (IMODIUM) 2 MG capsule  4 times daily PRN        09/28/20 1350    ondansetron (ZOFRAN ODT) 4 MG disintegrating tablet  Every 8 hours PRN        09/28/20 1350    methocarbamol (ROBAXIN) 500 MG tablet  Every 6 hours PRN        09/28/20 1350    ibuprofen (ADVIL) 600 MG tablet  Every 6 hours PRN        09/28/20 1350           Alvira MondaySchlossman, Desiray Orchard, MD 09/28/20 2217

## 2020-09-28 NOTE — Discharge Instructions (Signed)
It was a pleasure taking care of you today--we wish you a happy early 66th birthday! We have attached resources, put in a consult for peer support to contact you and have given you prescriptions to help you obtain your goal!

## 2020-09-28 NOTE — ED Notes (Addendum)
Given gingerale,tolerating po fluids well. Unable to give u/a at this time

## 2020-09-28 NOTE — ED Notes (Signed)
Attempted IV x 2 , unsuccessful,tol well. ED MD to use U/S for IV

## 2020-09-29 LAB — URINE CULTURE: Culture: <10000 — AB

## 2020-10-03 LAB — CULTURE, BLOOD (ROUTINE X 2)
Culture: NO GROWTH
Culture: NO GROWTH
Special Requests: ADEQUATE

## 2022-02-02 ENCOUNTER — Other Ambulatory Visit (HOSPITAL_BASED_OUTPATIENT_CLINIC_OR_DEPARTMENT_OTHER): Payer: Self-pay

## 2022-02-02 ENCOUNTER — Other Ambulatory Visit: Payer: Self-pay

## 2022-02-02 ENCOUNTER — Emergency Department (HOSPITAL_BASED_OUTPATIENT_CLINIC_OR_DEPARTMENT_OTHER): Payer: Medicaid Other

## 2022-02-02 ENCOUNTER — Emergency Department (HOSPITAL_BASED_OUTPATIENT_CLINIC_OR_DEPARTMENT_OTHER)
Admission: EM | Admit: 2022-02-02 | Discharge: 2022-02-02 | Disposition: A | Discharge status: Home / Self Care | Payer: Medicaid Other | Attending: Emergency Medicine | Admitting: Emergency Medicine

## 2022-02-02 ENCOUNTER — Encounter (HOSPITAL_BASED_OUTPATIENT_CLINIC_OR_DEPARTMENT_OTHER): Payer: Self-pay | Admitting: Emergency Medicine

## 2022-02-02 DIAGNOSIS — R1031 Right lower quadrant pain: Secondary | ICD-10-CM | POA: Diagnosis present

## 2022-02-02 DIAGNOSIS — K8020 Calculus of gallbladder without cholecystitis without obstruction: Secondary | ICD-10-CM | POA: Insufficient documentation

## 2022-02-02 DIAGNOSIS — K529 Noninfective gastroenteritis and colitis, unspecified: Secondary | ICD-10-CM | POA: Diagnosis not present

## 2022-02-02 LAB — CBC WITH DIFFERENTIAL/PLATELET
Abs Immature Granulocytes: 0.02 10*3/uL (ref 0.00–0.07)
Basophils Absolute: 0 10*3/uL (ref 0.0–0.1)
Basophils Relative: 1 %
Eosinophils Absolute: 0.2 10*3/uL (ref 0.0–0.5)
Eosinophils Relative: 2 %
HCT: 45.3 % (ref 36.0–46.0)
Hemoglobin: 15.3 g/dL — ABNORMAL HIGH (ref 12.0–15.0)
Immature Granulocytes: 0 %
Lymphocytes Relative: 34 %
Lymphs Abs: 3 10*3/uL (ref 0.7–4.0)
MCH: 30.7 pg (ref 26.0–34.0)
MCHC: 33.8 g/dL (ref 30.0–36.0)
MCV: 90.8 fL (ref 80.0–100.0)
Monocytes Absolute: 0.6 10*3/uL (ref 0.1–1.0)
Monocytes Relative: 7 %
Neutro Abs: 5 10*3/uL (ref 1.7–7.7)
Neutrophils Relative %: 56 %
Platelets: 220 10*3/uL (ref 150–400)
RBC: 4.99 MIL/uL (ref 3.87–5.11)
RDW: 12.1 % (ref 11.5–15.5)
WBC: 8.8 10*3/uL (ref 4.0–10.5)
nRBC: 0 % (ref 0.0–0.2)

## 2022-02-02 LAB — URINALYSIS, ROUTINE W REFLEX MICROSCOPIC
Bilirubin Urine: NEGATIVE
Glucose, UA: NEGATIVE mg/dL
Hgb urine dipstick: NEGATIVE
Ketones, ur: NEGATIVE mg/dL
Leukocytes,Ua: NEGATIVE
Nitrite: NEGATIVE
Protein, ur: NEGATIVE mg/dL
Specific Gravity, Urine: 1.02 (ref 1.005–1.030)
pH: 7.5 (ref 5.0–8.0)

## 2022-02-02 LAB — COMPREHENSIVE METABOLIC PANEL
ALT: 49 U/L — ABNORMAL HIGH (ref 0–44)
AST: 42 U/L — ABNORMAL HIGH (ref 15–41)
Albumin: 4 g/dL (ref 3.5–5.0)
Alkaline Phosphatase: 50 U/L (ref 38–126)
Anion gap: 7 (ref 5–15)
BUN: 9 mg/dL (ref 6–20)
CO2: 30 mmol/L (ref 22–32)
Calcium: 9.3 mg/dL (ref 8.9–10.3)
Chloride: 102 mmol/L (ref 98–111)
Creatinine, Ser: 0.71 mg/dL (ref 0.44–1.00)
GFR, Estimated: >60 mL/min (ref >60)
Glucose, Bld: 109 mg/dL — ABNORMAL HIGH (ref 70–99)
Potassium: 3.7 mmol/L (ref 3.5–5.1)
Sodium: 139 mmol/L (ref 135–145)
Total Bilirubin: 0.7 mg/dL (ref 0.3–1.2)
Total Protein: 7.9 g/dL (ref 6.5–8.1)

## 2022-02-02 LAB — LIPASE, BLOOD: Lipase: 22 U/L (ref 11–51)

## 2022-02-02 LAB — HCG, SERUM, QUALITATIVE: Preg, Serum: NEGATIVE

## 2022-02-02 MED ORDER — DIPHENHYDRAMINE HCL 50 MG/ML IJ SOLN
12.5000 mg | Freq: Once | INTRAMUSCULAR | Status: AC
  Administered 2022-02-02: 12.5 mg via INTRAVENOUS
  Filled 2022-02-02: qty 1

## 2022-02-02 MED ORDER — CIPROFLOXACIN HCL 500 MG PO TABS
500.0000 mg | ORAL_TABLET | Freq: Two times a day (BID) | ORAL | 0 refills | Status: AC
  Filled 2022-02-02: qty 10, 5d supply, fill #0

## 2022-02-02 MED ORDER — IOHEXOL 300 MG/ML  SOLN
100.0000 mL | Freq: Once | INTRAMUSCULAR | Status: AC | PRN
  Administered 2022-02-02: 100 mL via INTRAVENOUS

## 2022-02-02 MED ORDER — METRONIDAZOLE 500 MG PO TABS
500.0000 mg | ORAL_TABLET | Freq: Two times a day (BID) | ORAL | 0 refills | Status: AC
  Filled 2022-02-02: qty 10, 5d supply, fill #0

## 2022-02-02 MED ORDER — PROCHLORPERAZINE EDISYLATE 10 MG/2ML IJ SOLN
10.0000 mg | Freq: Once | INTRAMUSCULAR | Status: AC
  Administered 2022-02-02: 10 mg via INTRAVENOUS
  Filled 2022-02-02: qty 2

## 2022-02-02 MED ORDER — SODIUM CHLORIDE 0.9 % IV BOLUS
1000.0000 mL | Freq: Once | INTRAVENOUS | Status: AC
  Administered 2022-02-02: 1000 mL via INTRAVENOUS

## 2022-02-02 MED ORDER — ONDANSETRON HCL 4 MG PO TABS
4.0000 mg | ORAL_TABLET | Freq: Four times a day (QID) | ORAL | 0 refills | Status: AC
  Filled 2022-02-02: qty 12, 3d supply, fill #0

## 2022-02-02 NOTE — ED Provider Notes (Signed)
[facility] EMERGENCY DEPARTMENT Provider Note   CSN: [id number] Arrival date & time: 02/02/22  1141     History  Chief Complaint  Patient presents with   Abdominal Pain    Jennifer Ballard is a 41 y.o. female.  The history is provided by the patient.  Abdominal Pain Pain location:  RLQ and RUQ Pain quality: aching   Pain radiates to:  Does not radiate Pain severity:  Mild Onset quality:  Gradual Duration:  1 day Timing:  Constant Progression:  Unchanged Chronicity:  New Context: not previous surgeries, not recent illness, not sick contacts and not suspicious food intake   Relieved by:  Nothing Worsened by:  Nothing Associated symptoms: diarrhea and nausea   Associated symptoms: no anorexia, no belching, no chest pain, no chills, no constipation, no cough, no dysuria, no fatigue, no fever, no flatus, no hematemesis, no hematochezia, no hematuria, no melena, no shortness of breath, no sore throat, no vaginal bleeding, no vaginal discharge and no vomiting   Risk factors: has not had multiple surgeries   Risk factors comment:  Heroin use, last used today, hx of hysterectomy     Home Medications Prior to Admission medications   Medication Sig Start Date End Date Taking? Authorizing Provider  ciprofloxacin (CIPRO) 500 MG tablet Take 1 tablet (500 mg total) by mouth every 12 (twelve) hours for 5 days. 02/02/22 02/07/22 Yes Joette Schmoker, DO  metroNIDAZOLE (FLAGYL) 500 MG tablet Take 1 tablet (500 mg total) by mouth 2 (two) times daily for 5 days. 02/02/22 02/07/22 Yes Heinrich Fertig, DO  ondansetron (ZOFRAN) 4 MG tablet Take 1 tablet (4 mg total) by mouth every 6 (six) hours. 02/02/22  Yes Marsela Kuan, DO  diphenhydrAMINE (BENADRYL) 25 MG tablet Take 25-50 mg by mouth every 6 (six) hours as needed for allergies.    [provider]  famotidine (PEPCID) 20 MG tablet Take 1 tablet (20 mg total) by mouth 2 (two) times daily. 07/27/20   Renne CriglerGeiple, Joshua, PA-C  ibuprofen  (ADVIL) 600 MG tablet Take 1 tablet (600 mg total) by mouth every 6 (six) hours as needed (with food). 09/28/20   Alvira MondaySchlossman, Erin, MD  loperamide (IMODIUM) 2 MG capsule Take 1 capsule (2 mg total) by mouth 4 (four) times daily as needed for diarrhea or loose stools. 09/28/20   Alvira MondaySchlossman, Erin, MD  methocarbamol (ROBAXIN) 500 MG tablet Take 2 tablets (1,000 mg total) by mouth every 6 (six) hours as needed for muscle spasms. 09/28/20   Alvira MondaySchlossman, Erin, MD  ondansetron (ZOFRAN ODT) 4 MG disintegrating tablet Take 1 tablet (4 mg total) by mouth every 8 (eight) hours as needed for nausea or vomiting. 09/28/20   Alvira MondaySchlossman, Erin, MD  SUBOXONE 8-2 MG FILM Place under the tongue. 09/25/20   [provider]  pantoprazole (PROTONIX) 40 MG tablet Take 40 mg by mouth daily. 09/16/20 09/28/20  [provider]      Allergies    Amoxicillin, Eggs or egg-derived products, Penicillins, Wheat bran, and Doxycycline    Review of Systems   Review of Systems  Constitutional:  Negative for chills, fatigue and fever.  HENT:  Negative for sore throat.   Respiratory:  Negative for cough and shortness of breath.   Cardiovascular:  Negative for chest pain.  Gastrointestinal:  Positive for abdominal pain, diarrhea and nausea. Negative for anorexia, constipation, flatus, hematemesis, hematochezia, melena and vomiting.  Genitourinary:  Negative for dysuria, hematuria, vaginal bleeding and vaginal discharge.   Physical Exam  Updated Vital Signs BP 106/70    Pulse 63    Temp 98.5 F (36.9 C) (Oral)    Resp 16    Ht 5\' 6"  (1.676 m)    Wt 80.7 kg    LMP 03/21/2012    SpO2 96%    BMI 28.73 kg/m  Physical Exam Vitals and nursing note reviewed.  Constitutional:      General: She is not in acute distress.    Appearance: She is well-developed. She is not ill-appearing.  HENT:     Head: Normocephalic and atraumatic.     Mouth/Throat:     Mouth: Mucous membranes are moist.  Eyes:     Extraocular  Movements: Extraocular movements intact.     Conjunctiva/sclera: Conjunctivae normal.  Cardiovascular:     Rate and Rhythm: Normal rate and regular rhythm.     Heart sounds: Normal heart sounds. No murmur heard. Pulmonary:     Effort: Pulmonary effort is normal. No respiratory distress.     Breath sounds: Normal breath sounds.  Abdominal:     Palpations: Abdomen is soft.     Tenderness: There is abdominal tenderness in the right upper quadrant, right lower quadrant and suprapubic area.  Musculoskeletal:        General: No swelling.     Cervical back: Neck supple.  Skin:    General: Skin is warm and dry.     Capillary Refill: Capillary refill takes less than 2 seconds.  Neurological:     General: No focal deficit present.     Mental Status: She is alert.  Psychiatric:        Mood and Affect: Mood normal.    ED Results / Procedures / Treatments   Labs (all labs ordered are listed, but only abnormal results are displayed) Labs Reviewed  CBC WITH DIFFERENTIAL/PLATELET - Abnormal; Notable for the following components:      Result Value   Hemoglobin 15.3 (*)    All other components within normal limits  COMPREHENSIVE METABOLIC PANEL - Abnormal; Notable for the following components:   Glucose, Bld 109 (*)    AST 42 (*)    ALT 49 (*)    All other components within normal limits  URINALYSIS, ROUTINE W REFLEX MICROSCOPIC  HCG, SERUM, QUALITATIVE  LIPASE, BLOOD    EKG None  Radiology CT ABDOMEN PELVIS W CONTRAST  Result Date: 02/02/2022 CLINICAL DATA:  Right lower quadrant pain radiating to bilateral lower back, low-grade fever EXAM: CT ABDOMEN AND PELVIS WITH CONTRAST TECHNIQUE: Multidetector CT imaging of the abdomen and pelvis was performed using the standard protocol following bolus administration of intravenous contrast. RADIATION DOSE REDUCTION: This exam was performed according to the departmental dose-optimization program which includes automated exposure control,  adjustment of the mA and/or kV according to patient size and/or use of iterative reconstruction technique. CONTRAST:  04/04/2022 OMNIPAQUE IOHEXOL 300 MG/ML  SOLN COMPARISON:  CT abdomen/pelvis 09/28/2020 FINDINGS: Lower chest: Opacities in the dependent lung bases most likely reflect atelectasis. The imaged heart is unremarkable. Hepatobiliary: There are probable noncalcified gallstones within the gallbladder. There is no gallbladder wall thickening or pericholecystic fluid. There is no biliary ductal dilatation. Pancreas: Unremarkable. Spleen: Unremarkable. Adrenals/Urinary Tract: The adrenals are unremarkable. A small hypodense lesion in the right kidney most likely reflects a cyst. There are no other focal lesions. There are no renal stones. There are no stones along the course of either ureter. There is symmetric excretion of contrast into the collecting systems on the delayed  phase images. The bladder is unremarkable. Stomach/Bowel: The stomach is unremarkable. There is no evidence of bowel obstruction. Is mild apparent thickening of the distal colonic wall, though this may be due to underdistention. There is no other abnormal bowel wall thickening or inflammatory change. The appendix is normal (2-58, 5-60). Vascular/Lymphatic: The abdominal aorta is normal in course and caliber. The major branch vessels are patent. The main portal and splenic veins are patent. There is no abdominal or pelvic lymphadenopathy. Reproductive: Uterus is surgically absent. There is no adnexal mass. Other: There is no ascites or free air. Musculoskeletal: There is no acute osseous abnormality or aggressive osseous lesion. IMPRESSION: 1. Mild apparent wall thickening of the distal colon may be due to underdistention, though and mild infectious or inflammatory colitis could have a similar appearance. 2. Noncalcified gallstones in the gallbladder lumen without evidence of acute cholecystitis. 3. Normal appendix. Electronically Signed   By:  Lesia Hausen M.D.   On: 02/02/2022 13:28    Procedures Procedures    Medications Ordered in ED Medications  sodium chloride 0.9 % bolus 1,000 mL (1,000 mLs Intravenous New Bag/Given 02/02/22 1218)  prochlorperazine (COMPAZINE) injection 10 mg (10 mg Intravenous Given 02/02/22 1219)  diphenhydrAMINE (BENADRYL) injection 12.5 mg (12.5 mg Intravenous Given 02/02/22 1218)  iohexol (OMNIPAQUE) 300 MG/ML solution 100 mL (100 mLs Intravenous Contrast Given 02/02/22 1305)    ED Course/ Medical Decision Making/ A&P                           Medical Decision Making Amount and/or Complexity of Data Reviewed Labs: ordered. Radiology: ordered.  Risk Prescription drug management.   Jennifer Ballard is here with right lower abdominal pain.  History of heroin use with last use today, asthma, partial hysterectomy, irritable bowel syndrome.  Having worsening lower abdominal pain today.  Denies any vaginal bleeding or vaginal discharge.  Denies any chest pain or shortness of breath.  Has had some diarrhea today.  Denies any sick contacts.  Last heroin use was this morning.  Denies any active fever or chills.  She is tender mostly in the right side of her abdomen.  Some mild guarding.  Differential diagnosis includes colitis versus appendicitis versus cholecystitis versus IBS flare versus viral process.  Will give IV fluids, IV Compazine, IV Benadryl for symptomatic relief.  We will get CBC, CMP, lipase, urine study, CT scan abdomen pelvis to further evaluate.  I reviewed and interpreted labs.  No significant anemia, electrolyte abnormality, kidney injury.  Urinalysis negative for infection.  Pregnancy test is negative.  Gallbladder and liver enzymes overall unremarkable.  Lipase normal.  CT scan of abdomen and pelvis per radiology read was reviewed and shows no appendicitis.  No bowel obstruction.  May be mild colitis.  Some gallstones in the gallbladder but no evidence of cholecystitis.  She is not particularly  tender in the right upper quadrant.  Gallbladder enzymes unremarkable.  Liver enzymes at baseline.  No concern for cholecystitis.  Will prescribe antibiotics for possible colitis but this is likely inflammatory in nature.  Will prescribe Zofran.  On reevaluation she is feeling better.  Discharged in good condition.  This chart was dictated using voice recognition software.  Despite best efforts to proofread,  errors can occur which can change the documentation meaning.         Final Clinical Impression(s) / ED Diagnoses Final diagnoses:  Colitis    Rx / DC Orders ED  Discharge Orders          Ordered    ciprofloxacin (CIPRO) 500 MG tablet  Every 12 hours        02/02/22 1343    metroNIDAZOLE (FLAGYL) 500 MG tablet  2 times daily        02/02/22 1343    ondansetron (ZOFRAN) 4 MG tablet  Every 6 hours        02/02/22 1343              Cyan Clippinger, DO 02/02/22 1345

## 2022-02-02 NOTE — ED Notes (Signed)
ED Provider at bedside. 

## 2022-02-02 NOTE — ED Notes (Signed)
Patient transported to CT 

## 2022-02-02 NOTE — ED Triage Notes (Signed)
Pt arrives pov, to room in wheelchair, with c/o RLQ pain, radiating to bilateral lower back, low grade fever. Denies dysuria, recent constipation, now endorses diarrhea after medication. Also reports nausea. Tylenol and zofran at 1100. Endorses heroin this am ?

## 2022-04-07 ENCOUNTER — Emergency Department (HOSPITAL_BASED_OUTPATIENT_CLINIC_OR_DEPARTMENT_OTHER): Payer: Medicaid Other

## 2022-04-07 ENCOUNTER — Emergency Department (HOSPITAL_BASED_OUTPATIENT_CLINIC_OR_DEPARTMENT_OTHER)
Admission: EM | Admit: 2022-04-07 | Discharge: 2022-04-07 | Disposition: A | Discharge status: Home / Self Care | Payer: Medicaid Other | Attending: Emergency Medicine | Admitting: Emergency Medicine

## 2022-04-07 ENCOUNTER — Encounter (HOSPITAL_BASED_OUTPATIENT_CLINIC_OR_DEPARTMENT_OTHER): Payer: Self-pay | Admitting: Urology

## 2022-04-07 DIAGNOSIS — R0602 Shortness of breath: Secondary | ICD-10-CM | POA: Insufficient documentation

## 2022-04-07 DIAGNOSIS — M542 Cervicalgia: Secondary | ICD-10-CM | POA: Insufficient documentation

## 2022-04-07 DIAGNOSIS — R0789 Other chest pain: Secondary | ICD-10-CM | POA: Diagnosis not present

## 2022-04-07 DIAGNOSIS — D72829 Elevated white blood cell count, unspecified: Secondary | ICD-10-CM | POA: Diagnosis not present

## 2022-04-07 DIAGNOSIS — M436 Torticollis: Secondary | ICD-10-CM

## 2022-04-07 DIAGNOSIS — R079 Chest pain, unspecified: Secondary | ICD-10-CM | POA: Diagnosis present

## 2022-04-07 DIAGNOSIS — Z87891 Personal history of nicotine dependence: Secondary | ICD-10-CM | POA: Diagnosis not present

## 2022-04-07 DIAGNOSIS — Z79899 Other long term (current) drug therapy: Secondary | ICD-10-CM | POA: Diagnosis not present

## 2022-04-07 DIAGNOSIS — I10 Essential (primary) hypertension: Secondary | ICD-10-CM | POA: Diagnosis not present

## 2022-04-07 DIAGNOSIS — R778 Other specified abnormalities of plasma proteins: Secondary | ICD-10-CM | POA: Insufficient documentation

## 2022-04-07 DIAGNOSIS — R110 Nausea: Secondary | ICD-10-CM | POA: Diagnosis not present

## 2022-04-07 LAB — CBC WITH DIFFERENTIAL/PLATELET
Abs Immature Granulocytes: 0.03 10*3/uL (ref 0.00–0.07)
Basophils Absolute: 0.1 10*3/uL (ref 0.0–0.1)
Basophils Relative: 0 %
Eosinophils Absolute: 0.2 10*3/uL (ref 0.0–0.5)
Eosinophils Relative: 2 %
HCT: 44.1 % (ref 36.0–46.0)
Hemoglobin: 14.9 g/dL (ref 12.0–15.0)
Immature Granulocytes: 0 %
Lymphocytes Relative: 43 %
Lymphs Abs: 5 10*3/uL — ABNORMAL HIGH (ref 0.7–4.0)
MCH: 31 pg (ref 26.0–34.0)
MCHC: 33.8 g/dL (ref 30.0–36.0)
MCV: 91.7 fL (ref 80.0–100.0)
Monocytes Absolute: 1.2 10*3/uL — ABNORMAL HIGH (ref 0.1–1.0)
Monocytes Relative: 10 %
Neutro Abs: 5.2 10*3/uL (ref 1.7–7.7)
Neutrophils Relative %: 45 %
Platelets: 200 10*3/uL (ref 150–400)
RBC: 4.81 MIL/uL (ref 3.87–5.11)
RDW: 11.9 % (ref 11.5–15.5)
WBC: 11.7 10*3/uL — ABNORMAL HIGH (ref 4.0–10.5)
nRBC: 0 % (ref 0.0–0.2)

## 2022-04-07 LAB — COMPREHENSIVE METABOLIC PANEL
ALT: 46 U/L — ABNORMAL HIGH (ref 0–44)
AST: 32 U/L (ref 15–41)
Albumin: 4 g/dL (ref 3.5–5.0)
Alkaline Phosphatase: 54 U/L (ref 38–126)
Anion gap: 6 (ref 5–15)
BUN: 12 mg/dL (ref 6–20)
CO2: 29 mmol/L (ref 22–32)
Calcium: 9.3 mg/dL (ref 8.9–10.3)
Chloride: 105 mmol/L (ref 98–111)
Creatinine, Ser: 0.64 mg/dL (ref 0.44–1.00)
GFR, Estimated: >60 mL/min (ref >60)
Glucose, Bld: 96 mg/dL (ref 70–99)
Potassium: 4.2 mmol/L (ref 3.5–5.1)
Sodium: 140 mmol/L (ref 135–145)
Total Bilirubin: 0.5 mg/dL (ref 0.3–1.2)
Total Protein: 7.8 g/dL (ref 6.5–8.1)

## 2022-04-07 LAB — TROPONIN I (HIGH SENSITIVITY): Troponin I (High Sensitivity): 6 ng/L (ref <18)

## 2022-04-07 MED ORDER — METHOCARBAMOL 500 MG PO TABS: 500.0000 mg | ORAL_TABLET | Freq: Two times a day (BID) | ORAL | 0 refills | Status: AC

## 2022-04-07 MED ORDER — METHOCARBAMOL 500 MG PO TABS
500.0000 mg | ORAL_TABLET | Freq: Once | ORAL | Status: AC
  Administered 2022-04-07: 500 mg via ORAL
  Filled 2022-04-07: qty 1

## 2022-04-07 NOTE — ED Triage Notes (Signed)
Chest tightness, swelling in left side neck and shoulder, unable to move neck without pain  ?Started after colonoscopy on 03/29/2022 ? Reports nausea, denies any SOB ?Sent by surgeon that did colonoscopy  ?

## 2022-04-07 NOTE — ED Provider Notes (Signed)
?[facility] EMERGENCY DEPARTMENT ?Provider Note ? ? ?CSN: [id number] ?Arrival date & time: 04/07/22  1539 ? ?  ? ?History ? ?Chief Complaint  ?Patient presents with  ? Chest Pain  ? Torticollis  ? ? ?Jennifer Ballard is a 41 y.o. female.  With past medical history of tobacco use, hyperlipidemia, hypertension, history of drug abuse who presents to the emergency department with chest pain, neck pain. ? ?Patient states that for the past 3 days she has had intermittent central chest pain that she describes as, "almost like reflux but not." She has had associated nausea and 1 episode of shortness of breath. She states at times the pain begins in her abdomen and goes into her chest. No radiation to the arm or jaw.  She states that she had colonoscopy on 03/29/2022 and afterward had gas pains that were in her abdomen going up to her chest.  She is unsure if these 2 things are related.  She also states for the past 2 days she has had difficulty turning her neck.  She states that it is very tender, swollen and painful on the left side.  She does not remember sleeping oddly or any injuries. ? ? ?Chest Pain ?Associated symptoms: nausea and shortness of breath   ?Associated symptoms: no diaphoresis and no fever   ? ?  ? ?Home Medications ?Prior to Admission medications   ?Medication Sig Start Date End Date Taking? Authorizing Provider  ?diphenhydrAMINE (BENADRYL) 25 MG tablet Take 25-50 mg by mouth every 6 (six) hours as needed for allergies.    [provider]  ?famotidine (PEPCID) 20 MG tablet Take 1 tablet (20 mg total) by mouth 2 (two) times daily. 07/27/20   Renne CriglerGeiple, Joshua, PA-C  ?ibuprofen (ADVIL) 600 MG tablet Take 1 tablet (600 mg total) by mouth every 6 (six) hours as needed (with food). 09/28/20   Alvira MondaySchlossman, Erin, MD  ?loperamide (IMODIUM) 2 MG capsule Take 1 capsule (2 mg total) by mouth 4 (four) times daily as needed for diarrhea or loose stools. 09/28/20   Alvira MondaySchlossman, Erin, MD  ?methocarbamol (ROBAXIN)  500 MG tablet Take 2 tablets (1,000 mg total) by mouth every 6 (six) hours as needed for muscle spasms. 09/28/20   Alvira MondaySchlossman, Erin, MD  ?ondansetron (ZOFRAN ODT) 4 MG disintegrating tablet Take 1 tablet (4 mg total) by mouth every 8 (eight) hours as needed for nausea or vomiting. 09/28/20   Alvira MondaySchlossman, Erin, MD  ?ondansetron (ZOFRAN) 4 MG tablet Take 1 tablet (4 mg total) by mouth every 6 (six) hours. 02/02/22   Curatolo, Adam, DO  ?SUBOXONE 8-2 MG FILM Place under the tongue. 09/25/20   [provider]  ?pantoprazole (PROTONIX) 40 MG tablet Take 40 mg by mouth daily. 09/16/20 09/28/20  [provider]  ?   ? ?Allergies    ?Amoxicillin, Eggs or egg-derived products, Penicillins, Wheat bran, and Doxycycline   ? ?Review of Systems   ?Review of Systems  ?Constitutional:  Negative for diaphoresis and fever.  ?Respiratory:  Positive for shortness of breath.   ?Cardiovascular:  Positive for chest pain.  ?Gastrointestinal:  Positive for nausea.  ?Musculoskeletal:  Positive for neck stiffness.  ?All other systems reviewed and are negative. ? ?Physical Exam ?Updated Vital Signs ?BP (!) 148/96 (BP Location: Right Arm)   Pulse 90   Temp 98.4 ?F (36.9 ?C) (Oral)   Resp 20   Ht 5\' 6"  (1.676 m)   Wt 79.4 kg   LMP 03/21/2012   SpO2  97%   BMI 28.25 kg/m?  ?Physical Exam ?Vitals and nursing note reviewed.  ?Constitutional:   ?   General: She is not in acute distress. ?   Appearance: Normal appearance. She is well-developed. She is not ill-appearing or toxic-appearing.  ?HENT:  ?   Head: Normocephalic and atraumatic.  ?Eyes:  ?   General: No scleral icterus. ?   Extraocular Movements: Extraocular movements intact.  ?   Pupils: Pupils are equal, round, and reactive to light.  ?Neck:  ?   Vascular: No JVD.  ? ?Cardiovascular:  ?   Rate and Rhythm: Normal rate and regular rhythm.  ?   Pulses:     ?     Radial pulses are 2+ on the right side and 2+ on the left side.  ?   Heart sounds: Normal heart sounds. No  murmur heard. ?Pulmonary:  ?   Effort: Pulmonary effort is normal. No tachypnea or respiratory distress.  ?   Breath sounds: Normal breath sounds.  ?Chest:  ?   Chest wall: No tenderness.  ?Abdominal:  ?   General: Bowel sounds are normal.  ?   Palpations: Abdomen is soft.  ?Musculoskeletal:  ?   Cervical back: Torticollis present. Pain with movement and muscular tenderness present. No spinous process tenderness. Decreased range of motion.  ?Skin: ?   General: Skin is warm and dry.  ?   Capillary Refill: Capillary refill takes less than 2 seconds.  ?Neurological:  ?   General: No focal deficit present.  ?   Mental Status: She is alert and oriented to person, place, and time.  ?Psychiatric:     ?   Mood and Affect: Mood normal.     ?   Behavior: Behavior normal.  ? ? ?ED Results / Procedures / Treatments   ?Labs ?(all labs ordered are listed, but only abnormal results are displayed) ?Labs Reviewed  ?COMPREHENSIVE METABOLIC PANEL - Abnormal; Notable for the following components:  ?    Result Value  ? ALT 46 (*)   ? All other components within normal limits  ?CBC WITH DIFFERENTIAL/PLATELET - Abnormal; Notable for the following components:  ? WBC 11.7 (*)   ? Lymphs Abs 5.0 (*)   ? Monocytes Absolute 1.2 (*)   ? All other components within normal limits  ?TROPONIN I (HIGH SENSITIVITY)  ?TROPONIN I (HIGH SENSITIVITY)  ? ? ?EKG ?EKG Interpretation ? ?Date/Time:  Wednesday Apr 07 2022 15:51:27 EDT ?Ventricular Rate:  83 ?PR Interval:  172 ?QRS Duration: 86 ?QT Interval:  394 ?QTC Calculation: 462 ?R Axis:   49 ?Text Interpretation: Normal sinus rhythm Normal ECG When compared with ECG of 28-Sep-2020 08:18, PREVIOUS ECG IS PRESENT Confirmed by Gwyneth Sprout ([id number]) on 04/07/2022 6:16:48 PM ? ?Radiology ?DG Chest 2 View ? ?Result Date: 04/07/2022 ?CLINICAL DATA:  Chest pain. EXAM: CHEST - 2 VIEW COMPARISON:  07/17/2020 FINDINGS: The cardiomediastinal contours are normal. Mild peribronchial thickening. Subsegmental  atelectasis in the lingula. Pulmonary vasculature is normal. No consolidation, pleural effusion, or pneumothorax. No acute osseous abnormalities are seen. IMPRESSION: Mild peribronchial thickening suggesting bronchitis or asthma. Subsegmental lingular atelectasis. Electronically Signed   By: Narda Rutherford M.D.   On: 04/07/2022 17:44   ? ?Procedures ?Procedures  ? ? ?Medications Ordered in ED ?Medications  ?methocarbamol (ROBAXIN) tablet 500 mg (500 mg Oral Given 04/07/22 1724)  ? ? ?ED Course/ Medical Decision Making/ A&P ?  ?                        ?  Medical Decision Making ?Amount and/or Complexity of Data Reviewed ?Labs: ordered. ?Radiology: ordered. ? ?Risk ?Prescription drug management. ? ?This patient presents to the ED for concern of chest pain, neck pain, this involves an extensive number of treatment options, and is a complaint that carries with it a high risk of complications and morbidity.  The differential diagnosis includes torticollis, fracture or subluxation, radiculopathy. Chest pain: ACS, PE, PTX, pleural effusion, pneumonia, dissection, arrhythmia, atypical, etc.  ? ?Co morbidities that complicate the patient evaluation ?Tobacco use, hx polysubstance use, HLD, HTN ? ?Additional history obtained:  ?Additional history obtained from: family at bedside  ?External records from outside source obtained and reviewed including: recent ED visit physician notes ? ?EKG: ?EKG: normal EKG, normal sinus rhythm.  ? ?Cardiac Monitoring: ?The patient was maintained on a cardiac monitor.  I personally viewed and interpreted the cardiac monitored which showed an underlying rhythm of: sinus rhythm  ? ?Lab Results: ?I personally ordered, reviewed, and interpreted labs. ?Pertinent results include: ?CBC with mild leukocytosis to 11.7, likely reactive and non specific ?CMP within normal limits ?Troponin x1 6 negative ? ?Imaging Studies ordered:  ?I ordered imaging studies which included x-ray.  I independently reviewed &  interpreted imaging & am in agreement with radiology impression. ?Imaging shows: ?CXR: Peribronchial thickening suggesting bronchitis or asthma ? ?Medications  ?I ordered medication including Robaxin for torticollis ?Re

## 2022-04-07 NOTE — Discharge Instructions (Signed)
You were seen in the emergency department today for neck pain, chest pain.  Your heart work-up was reassuring.  You likely have torticollis or a crick in your neck.  I am prescribing you a muscle relaxant.  You can use this twice a day as needed.  Do not drive on this medication.  Do not mix this medication with opiates or alcohol.  Please return for worsening chest pain or shortness of breath.  Please follow-up with your primary care provider as needed. ?

## 2023-02-23 ENCOUNTER — Emergency Department (HOSPITAL_BASED_OUTPATIENT_CLINIC_OR_DEPARTMENT_OTHER): Payer: Medicaid Other

## 2023-02-23 ENCOUNTER — Encounter (HOSPITAL_BASED_OUTPATIENT_CLINIC_OR_DEPARTMENT_OTHER): Payer: Self-pay | Admitting: Emergency Medicine

## 2023-02-23 ENCOUNTER — Other Ambulatory Visit: Payer: Self-pay

## 2023-02-23 ENCOUNTER — Emergency Department (HOSPITAL_BASED_OUTPATIENT_CLINIC_OR_DEPARTMENT_OTHER)
Admission: EM | Admit: 2023-02-23 | Discharge: 2023-02-23 | Discharge status: Against Medical Advice | Payer: Medicaid Other | Attending: Emergency Medicine | Admitting: Emergency Medicine

## 2023-02-23 DIAGNOSIS — Z5321 Procedure and treatment not carried out due to patient leaving prior to being seen by health care provider: Secondary | ICD-10-CM | POA: Insufficient documentation

## 2023-02-23 DIAGNOSIS — R0781 Pleurodynia: Secondary | ICD-10-CM | POA: Diagnosis not present

## 2023-02-23 HISTORY — DX: Unspecified viral hepatitis C without hepatic coma: B19.20

## 2023-02-23 NOTE — ED Triage Notes (Signed)
Pt to ED via [facility] w/ c/o LT rib area CP that started while she was doing house work; she took baby ASA x 4 and a Dramamine tablet; pain decreased en route

## 2024-01-04 ENCOUNTER — Other Ambulatory Visit: Payer: Self-pay

## 2024-01-04 ENCOUNTER — Emergency Department (HOSPITAL_BASED_OUTPATIENT_CLINIC_OR_DEPARTMENT_OTHER)
Admission: EM | Admit: 2024-01-04 | Discharge: 2024-01-04 | Disposition: A | Discharge status: Home / Self Care | Payer: Medicaid Other | Attending: Emergency Medicine | Admitting: Emergency Medicine

## 2024-01-04 ENCOUNTER — Encounter (HOSPITAL_BASED_OUTPATIENT_CLINIC_OR_DEPARTMENT_OTHER): Payer: Self-pay | Admitting: Radiology

## 2024-01-04 ENCOUNTER — Emergency Department (HOSPITAL_BASED_OUTPATIENT_CLINIC_OR_DEPARTMENT_OTHER): Payer: Medicaid Other

## 2024-01-04 DIAGNOSIS — I10 Essential (primary) hypertension: Secondary | ICD-10-CM | POA: Diagnosis not present

## 2024-01-04 DIAGNOSIS — R1031 Right lower quadrant pain: Secondary | ICD-10-CM | POA: Insufficient documentation

## 2024-01-04 DIAGNOSIS — D72829 Elevated white blood cell count, unspecified: Secondary | ICD-10-CM | POA: Insufficient documentation

## 2024-01-04 DIAGNOSIS — F17200 Nicotine dependence, unspecified, uncomplicated: Secondary | ICD-10-CM | POA: Insufficient documentation

## 2024-01-04 DIAGNOSIS — Z20822 Contact with and (suspected) exposure to covid-19: Secondary | ICD-10-CM | POA: Insufficient documentation

## 2024-01-04 DIAGNOSIS — R110 Nausea: Secondary | ICD-10-CM | POA: Insufficient documentation

## 2024-01-04 DIAGNOSIS — J45909 Unspecified asthma, uncomplicated: Secondary | ICD-10-CM | POA: Insufficient documentation

## 2024-01-04 DIAGNOSIS — Z79899 Other long term (current) drug therapy: Secondary | ICD-10-CM | POA: Insufficient documentation

## 2024-01-04 LAB — COMPREHENSIVE METABOLIC PANEL
ALT: 18 U/L (ref 0–44)
AST: 22 U/L (ref 15–41)
Albumin: 4.1 g/dL (ref 3.5–5.0)
Alkaline Phosphatase: 45 U/L (ref 38–126)
Anion gap: 9 (ref 5–15)
BUN: 10 mg/dL (ref 6–20)
CO2: 29 mmol/L (ref 22–32)
Calcium: 9.3 mg/dL (ref 8.9–10.3)
Chloride: 98 mmol/L (ref 98–111)
Creatinine, Ser: 0.64 mg/dL (ref 0.44–1.00)
GFR, Estimated: >60 mL/min (ref >60)
Glucose, Bld: 78 mg/dL (ref 70–99)
Potassium: 3.3 mmol/L — ABNORMAL LOW (ref 3.5–5.1)
Sodium: 136 mmol/L (ref 135–145)
Total Bilirubin: 0.8 mg/dL (ref 0.0–1.2)
Total Protein: 7.8 g/dL (ref 6.5–8.1)

## 2024-01-04 LAB — CBC
HCT: 40.8 % (ref 36.0–46.0)
Hemoglobin: 13.4 g/dL (ref 12.0–15.0)
MCH: 29.7 pg (ref 26.0–34.0)
MCHC: 32.8 g/dL (ref 30.0–36.0)
MCV: 90.5 fL (ref 80.0–100.0)
Platelets: 211 10*3/uL (ref 150–400)
RBC: 4.51 MIL/uL (ref 3.87–5.11)
RDW: 12.7 % (ref 11.5–15.5)
WBC: 13.6 10*3/uL — ABNORMAL HIGH (ref 4.0–10.5)
nRBC: 0 % (ref 0.0–0.2)

## 2024-01-04 LAB — URINALYSIS, ROUTINE W REFLEX MICROSCOPIC
Bilirubin Urine: NEGATIVE
Glucose, UA: NEGATIVE mg/dL
Hgb urine dipstick: NEGATIVE
Ketones, ur: NEGATIVE mg/dL
Leukocytes,Ua: NEGATIVE
Nitrite: NEGATIVE
Protein, ur: NEGATIVE mg/dL
Specific Gravity, Urine: >=1.03 (ref 1.005–1.030)
pH: 5.5 (ref 5.0–8.0)

## 2024-01-04 LAB — RESP PANEL BY RT-PCR (RSV, FLU A&B, COVID)  RVPGX2
Influenza A by PCR: NEGATIVE
Influenza B by PCR: NEGATIVE
Resp Syncytial Virus by PCR: NEGATIVE
SARS Coronavirus 2 by RT PCR: NEGATIVE

## 2024-01-04 LAB — LIPASE, BLOOD: Lipase: 18 U/L (ref 11–51)

## 2024-01-04 MED ORDER — DICYCLOMINE HCL 20 MG PO TABS: 20.0000 mg | ORAL_TABLET | Freq: Three times a day (TID) | ORAL | 0 refills | Status: AC | PRN

## 2024-01-04 MED ORDER — IOHEXOL 300 MG/ML  SOLN
100.0000 mL | Freq: Once | INTRAMUSCULAR | Status: AC | PRN
  Administered 2024-01-04: 100 mL via INTRAVENOUS

## 2024-01-04 MED ORDER — ONDANSETRON 4 MG PO TBDP: 4.0000 mg | ORAL_TABLET | Freq: Three times a day (TID) | ORAL | 0 refills | Status: DC | PRN

## 2024-01-04 MED ORDER — SODIUM CHLORIDE 0.9 % IV BOLUS
500.0000 mL | Freq: Once | INTRAVENOUS | Status: AC
  Administered 2024-01-04: 500 mL via INTRAVENOUS

## 2024-01-04 MED ORDER — ONDANSETRON HCL 4 MG/2ML IJ SOLN
4.0000 mg | Freq: Once | INTRAMUSCULAR | Status: AC
  Administered 2024-01-04: 4 mg via INTRAVENOUS
  Filled 2024-01-04: qty 2

## 2024-01-04 NOTE — ED Provider Notes (Signed)
 Emergency Department Provider Note   I have reviewed the triage vital signs and the nursing notes.   HISTORY  Chief Complaint Abdominal Pain   HPI Jennifer Ballard is a 43 y.o. female presents emergency department evaluation of abdominal pain.  She has had intermittent pain over the past 2 weeks.  She has tried Tylenol  without relief.  Pain is worse in the right abdomen but radiates across the belly diffusely.  No nausea or vomiting.  No diarrhea.  She has no URI symptoms.  She has multiple family members sick with the flu but she herself is feeling fine. No CP or SOB.   Past Medical History:  Diagnosis Date   Asthma    Chronic ear infection    Chronic lower back pain    Hearing loss in left ear    Hepatitis C    Heroin addiction (HCC)    Hyperlipidemia    Hypertension    Kidney stones    Marijuana dependence (HCC)     Review of Systems  Constitutional: No fever/chills Cardiovascular: Positive chest pain. Respiratory: Denies shortness of breath. Gastrointestinal: No abdominal pain.  No nausea, no vomiting.  No diarrhea.  No constipation. Genitourinary: Negative for dysuria. Musculoskeletal: Negative for back pain. Skin: Negative for rash. Neurological: Negative for headaches, focal weakness or numbness.  ____________________________________________   PHYSICAL EXAM:  VITAL SIGNS: ED Triage Vitals  Encounter Vitals Group     BP 01/04/24 1607 117/79     Pulse Rate 01/04/24 1607 70     Resp 01/04/24 1607 18     Temp 01/04/24 1607 97.8 F (36.6 C)     Temp src --      SpO2 01/04/24 1607 95 %     Weight 01/04/24 1606 160 lb (72.6 kg)     Height 01/04/24 1606 5' 6 (1.676 m)   Constitutional: Alert and oriented. Well appearing and in no acute distress. Eyes: Conjunctivae are normal Head: Atraumatic. Nose: No congestion/rhinnorhea. Mouth/Throat: Mucous membranes are moist.  Neck: No stridor.   Cardiovascular: Normal rate, regular rhythm. Good peripheral  circulation. Grossly normal heart sounds.   Respiratory: Normal respiratory effort.  No retractions. Lungs CTAB. Gastrointestinal: Soft with mild right abdominal tenderness. No peritonitis. No distention.  Musculoskeletal:  No gross deformities of extremities. Neurologic:  Normal speech and language.  Skin:  Skin is warm, dry and intact. No rash noted.  ____________________________________________   LABS (all labs ordered are listed, but only abnormal results are displayed)  Labs Reviewed  COMPREHENSIVE METABOLIC PANEL - Abnormal; Notable for the following components:      Result Value   Potassium 3.3 (*)    All other components within normal limits  CBC - Abnormal; Notable for the following components:   WBC 13.6 (*)    All other components within normal limits  RESP PANEL BY RT-PCR (RSV, FLU A&B, COVID)  RVPGX2  LIPASE, BLOOD  URINALYSIS, ROUTINE W REFLEX MICROSCOPIC   ____________________________________________  RADIOLOGY  CT ABDOMEN PELVIS W CONTRAST Result Date: 01/04/2024 CLINICAL DATA:  Abdominal pain EXAM: CT ABDOMEN AND PELVIS WITH CONTRAST TECHNIQUE: Multidetector CT imaging of the abdomen and pelvis was performed using the standard protocol following bolus administration of intravenous contrast. RADIATION DOSE REDUCTION: This exam was performed according to the departmental dose-optimization program which includes automated exposure control, adjustment of the mA and/or kV according to patient size and/or use of iterative reconstruction technique. CONTRAST:  OMNIPAQUE  IOHEXOL  300 MG/ML  SOLN COMPARISON:  02/02/2022  FINDINGS: Lower Chest: Normal. Hepatobiliary: Normal hepatic contours. No intra- or extrahepatic biliary dilatation. There is cholelithiasis without acute inflammation. Pancreas: Normal pancreas. No ductal dilatation or peripancreatic fluid collection. Spleen: Normal. Adrenals/Urinary Tract: The adrenal glands are normal. No hydronephrosis,  nephroureterolithiasis or solid renal mass. The urinary bladder is normal for degree of distention Stomach/Bowel: There is no hiatal hernia. Normal duodenal course and caliber. No small bowel dilatation or inflammation. No focal colonic abnormality. Normal appendix. Vascular/Lymphatic: Normal course and caliber of the major abdominal vessels. No abdominal or pelvic lymphadenopathy. Reproductive: Normal uterus. No adnexal mass. Other: None. Musculoskeletal: No bony spinal canal stenosis or focal osseous abnormality. IMPRESSION: 1. No acute abnormality of the abdomen or pelvis. 2. Cholelithiasis without acute inflammation. Electronically Signed   By: Franky Stanford M.D.   On: 01/04/2024 21:19    ____________________________________________   PROCEDURES  Procedure(s) performed:   Procedures  None ____________________________________________   INITIAL IMPRESSION / ASSESSMENT AND PLAN / ED COURSE  Pertinent labs & imaging results that were available during my care of the patient were reviewed by me and considered in my medical decision making (see chart for details).   This patient is Presenting for Evaluation of abdominal pain, which does require a range of treatment options, and is a complaint that involves a high risk of morbidity and mortality.  The Differential Diagnoses includes but is not exclusive to acute coronary syndrome, aortic dissection, pulmonary embolism, cardiac tamponade, community-acquired pneumonia, pericarditis, musculoskeletal chest wall pain, etc.   Critical Interventions-    Medications  ondansetron  (ZOFRAN ) injection 4 mg (4 mg Intravenous Given 01/04/24 2030)  sodium chloride  0.9 % bolus 500 mL (0 mLs Intravenous Stopped 01/04/24 2151)  iohexol  (OMNIPAQUE ) 300 MG/ML solution 100 mL (100 mLs Intravenous Contrast Given 01/04/24 2106)    Reassessment after intervention: pain improved.    Clinical Laboratory Tests Ordered, included CBC with leukocytosis to 13.6.  COVID,  flu, RSV negative.  UA without infection.  CMP shows normal LFTs and bilirubin.  Radiologic Tests Ordered, included CT abdomen/pelvis. I independently interpreted the images and agree with radiology interpretation.   Cardiac Monitor Tracing which shows NSR.    Social Determinants of Health Risk patient is a smoker.   Medical Decision Making: Summary:  Patient presents to the emergency department with abdominal pain, worse on the right abdomen.  Mild tenderness on exam.  Plan for CT abdomen pelvis to rule out acute appendicitis although overall suspicion is low.  Reevaluation with update and discussion with patient. CT reassuring. Plan for IBS treatment at home and close PCP follow up.   Considered admission but ED workup is reassuring.   Patient's presentation is most consistent with acute presentation with potential threat to life or bodily function.   Disposition: discharge  ____________________________________________  FINAL CLINICAL IMPRESSION(S) / ED DIAGNOSES  Final diagnoses:  Right lower quadrant abdominal pain  Nausea     NEW OUTPATIENT MEDICATIONS STARTED DURING THIS VISIT:  Discharge Medication List as of 01/04/2024 10:13 PM     START taking these medications   Details  dicyclomine  (BENTYL ) 20 MG tablet Take 1 tablet (20 mg total) by mouth 3 (three) times daily as needed for spasms., Starting Wed 01/04/2024, Normal        Note:  This document was prepared using Dragon voice recognition software and may include unintentional dictation errors.  Fonda Law, MD, [facility]    Sha Amer, Fonda MATSU, MD 01/05/24 [phone]

## 2024-01-04 NOTE — ED Notes (Signed)
 Pt stated she's an IV drug user. Unable to gain IV access. MD made aware.

## 2024-01-04 NOTE — Discharge Instructions (Signed)

## 2024-01-04 NOTE — ED Triage Notes (Signed)
 Pt states she has had abd pain for the past 2 week. Pt states tylenol  helps the pain. Pt is located more on the right side but radiates all the way across her belly. Pt states she has nausea with it and took her home nausea medication prior to arrival. Pt does endorse her son and husband had the flu 2 weeks ago.

## 2024-01-08 ENCOUNTER — Emergency Department (HOSPITAL_BASED_OUTPATIENT_CLINIC_OR_DEPARTMENT_OTHER): Payer: Medicaid Other

## 2024-01-08 ENCOUNTER — Encounter (HOSPITAL_BASED_OUTPATIENT_CLINIC_OR_DEPARTMENT_OTHER): Payer: Self-pay | Admitting: Emergency Medicine

## 2024-01-08 ENCOUNTER — Emergency Department (HOSPITAL_BASED_OUTPATIENT_CLINIC_OR_DEPARTMENT_OTHER)
Admission: EM | Admit: 2024-01-08 | Discharge: 2024-01-08 | Disposition: A | Discharge status: Home / Self Care | Payer: Medicaid Other | Attending: Emergency Medicine | Admitting: Emergency Medicine

## 2024-01-08 DIAGNOSIS — I10 Essential (primary) hypertension: Secondary | ICD-10-CM | POA: Diagnosis not present

## 2024-01-08 DIAGNOSIS — K8020 Calculus of gallbladder without cholecystitis without obstruction: Secondary | ICD-10-CM | POA: Insufficient documentation

## 2024-01-08 DIAGNOSIS — K8050 Calculus of bile duct without cholangitis or cholecystitis without obstruction: Secondary | ICD-10-CM

## 2024-01-08 DIAGNOSIS — R109 Unspecified abdominal pain: Secondary | ICD-10-CM | POA: Diagnosis present

## 2024-01-08 LAB — COMPREHENSIVE METABOLIC PANEL
ALT: 23 U/L (ref 0–44)
AST: 24 U/L (ref 15–41)
Albumin: 3.9 g/dL (ref 3.5–5.0)
Alkaline Phosphatase: 47 U/L (ref 38–126)
Anion gap: 9 (ref 5–15)
BUN: 8 mg/dL (ref 6–20)
CO2: 29 mmol/L (ref 22–32)
Calcium: 9.3 mg/dL (ref 8.9–10.3)
Chloride: 99 mmol/L (ref 98–111)
Creatinine, Ser: 0.66 mg/dL (ref 0.44–1.00)
GFR, Estimated: >60 mL/min (ref >60)
Glucose, Bld: 84 mg/dL (ref 70–99)
Potassium: 3.4 mmol/L — ABNORMAL LOW (ref 3.5–5.1)
Sodium: 137 mmol/L (ref 135–145)
Total Bilirubin: 0.6 mg/dL (ref 0.0–1.2)
Total Protein: 7.6 g/dL (ref 6.5–8.1)

## 2024-01-08 LAB — CBC WITH DIFFERENTIAL/PLATELET
Abs Immature Granulocytes: 0.01 10*3/uL (ref 0.00–0.07)
Basophils Absolute: 0 10*3/uL (ref 0.0–0.1)
Basophils Relative: 0 %
Eosinophils Absolute: 0 10*3/uL (ref 0.0–0.5)
Eosinophils Relative: 0 %
HCT: 42.5 % (ref 36.0–46.0)
Hemoglobin: 14 g/dL (ref 12.0–15.0)
Immature Granulocytes: 0 %
Lymphocytes Relative: 22 %
Lymphs Abs: 1.6 10*3/uL (ref 0.7–4.0)
MCH: 29.4 pg (ref 26.0–34.0)
MCHC: 32.9 g/dL (ref 30.0–36.0)
MCV: 89.3 fL (ref 80.0–100.0)
Monocytes Absolute: 0.6 10*3/uL (ref 0.1–1.0)
Monocytes Relative: 8 %
Neutro Abs: 5.1 10*3/uL (ref 1.7–7.7)
Neutrophils Relative %: 70 %
Platelets: 210 10*3/uL (ref 150–400)
RBC: 4.76 MIL/uL (ref 3.87–5.11)
RDW: 12.8 % (ref 11.5–15.5)
WBC: 7.3 10*3/uL (ref 4.0–10.5)
nRBC: 0 % (ref 0.0–0.2)

## 2024-01-08 LAB — URINALYSIS, ROUTINE W REFLEX MICROSCOPIC
Bilirubin Urine: NEGATIVE
Glucose, UA: NEGATIVE mg/dL
Hgb urine dipstick: NEGATIVE
Ketones, ur: NEGATIVE mg/dL
Leukocytes,Ua: NEGATIVE
Nitrite: NEGATIVE
Protein, ur: NEGATIVE mg/dL
Specific Gravity, Urine: 1.02 (ref 1.005–1.030)
pH: 7.5 (ref 5.0–8.0)

## 2024-01-08 LAB — LIPASE, BLOOD: Lipase: 17 U/L (ref 11–51)

## 2024-01-08 MED ORDER — IOHEXOL 300 MG/ML  SOLN
80.0000 mL | Freq: Once | INTRAMUSCULAR | Status: AC | PRN
  Administered 2024-01-08: 80 mL via INTRAVENOUS

## 2024-01-08 MED ORDER — ONDANSETRON 4 MG PO TBDP: 4.0000 mg | ORAL_TABLET | Freq: Three times a day (TID) | ORAL | 0 refills | Status: AC | PRN

## 2024-01-08 MED ORDER — KETOROLAC TROMETHAMINE 15 MG/ML IJ SOLN
15.0000 mg | Freq: Once | INTRAMUSCULAR | Status: AC
  Administered 2024-01-08: 15 mg via INTRAVENOUS
  Filled 2024-01-08: qty 1

## 2024-01-08 MED ORDER — ONDANSETRON HCL 4 MG/2ML IJ SOLN
4.0000 mg | Freq: Once | INTRAMUSCULAR | Status: AC
  Administered 2024-01-08: 4 mg via INTRAVENOUS
  Filled 2024-01-08: qty 2

## 2024-01-08 MED ORDER — KETOROLAC TROMETHAMINE 15 MG/ML IJ SOLN: 15.0000 mg | Freq: Once | INTRAMUSCULAR | Status: DC

## 2024-01-08 MED ORDER — ONDANSETRON 4 MG PO TBDP: 4.0000 mg | ORAL_TABLET | Freq: Once | ORAL | Status: DC

## 2024-01-08 NOTE — ED Provider Notes (Signed)
 [facility] Provider Note   CSN: [id number] Arrival date & time: 01/08/24  1159     History  Chief Complaint  Patient presents with   Abdominal Pain    KARYNA BESSLER is a 43 y.o. female.  43 year old female with a history of heroin use, hypertension, hyperlipidemia, and marijuana use presents emergency department with abdominal pain.  Patient reports that in her right mid abdomen she has had pain for the past 3 weeks.  Says that she has had nausea and vomiting associated with it.  Worsened with eating.  Has had intermittent fevers.  No diarrhea.  Says that it caused her to relapse and start using IV heroin again.  Last used this morning.  Was seen here on 2/5 and had a CT scan that did not show appendicitis but did show gallstones.  Went to her primary care doctor today who was concerned about appendicitis given the location of her pain.  Denies vaginal discharge.  History of hysterectomy.       Home Medications Prior to Admission medications   Medication Sig Start Date End Date Taking? Authorizing Provider  dicyclomine  (BENTYL ) 20 MG tablet Take 1 tablet (20 mg total) by mouth 3 (three) times daily as needed for spasms. 01/04/24   Long, Fonda MATSU, MD  diphenhydrAMINE  (BENADRYL ) 25 MG tablet Take 25-50 mg by mouth every 6 (six) hours as needed for allergies.    [provider]  famotidine  (PEPCID ) 20 MG tablet Take 1 tablet (20 mg total) by mouth 2 (two) times daily. 07/27/20   Geiple, Joshua, PA-C  ibuprofen  (ADVIL ) 600 MG tablet Take 1 tablet (600 mg total) by mouth every 6 (six) hours as needed (with food). 09/28/20   Dreama Longs, MD  loperamide  (IMODIUM ) 2 MG capsule Take 1 capsule (2 mg total) by mouth 4 (four) times daily as needed for diarrhea or loose stools. 09/28/20   Dreama Longs, MD  methocarbamol  (ROBAXIN ) 500 MG tablet Take 1 tablet (500 mg total) by mouth 2 (two) times daily. 04/07/22   Autry, Lauren E, PA-C   ondansetron  (ZOFRAN ) 4 MG tablet Take 1 tablet (4 mg total) by mouth every 6 (six) hours. 02/02/22   Curatolo, Adam, DO  ondansetron  (ZOFRAN -ODT) 4 MG disintegrating tablet Take 1 tablet (4 mg total) by mouth every 8 (eight) hours as needed. 01/08/24   Yolande Lamar BROCKS, MD  SUBOXONE 8-2 MG FILM Place under the tongue. 09/25/20   [provider]  pantoprazole (PROTONIX) 40 MG tablet Take 40 mg by mouth daily. 09/16/20 09/28/20  [provider]      Allergies    Amoxicillin, Egg-derived products, Penicillins, Wheat, and Doxycycline     Review of Systems   Review of Systems  Physical Exam Updated Vital Signs BP 119/71   Pulse 64   Temp 98.1 F (36.7 C)   Resp 20   Ht 5' 6 (1.676 m)   Wt 72.6 kg   LMP 03/21/2012   SpO2 95%   BMI 25.83 kg/m  Physical Exam Vitals and nursing note reviewed.  Constitutional:      General: She is not in acute distress.    Appearance: She is well-developed.  HENT:     Head: Normocephalic and atraumatic.     Right Ear: External ear normal.     Left Ear: External ear normal.     Nose: Nose normal.  Eyes:     Extraocular Movements: Extraocular movements intact.  Conjunctiva/sclera: Conjunctivae normal.     Pupils: Pupils are equal, round, and reactive to light.  Pulmonary:     Effort: Pulmonary effort is normal. No respiratory distress.  Abdominal:     General: Abdomen is flat. There is no distension.     Palpations: Abdomen is soft. There is no mass.     Tenderness: There is abdominal tenderness (Right mid abdomen). There is no guarding.  Musculoskeletal:     Cervical back: Normal range of motion and neck supple.     Right lower leg: No edema.     Left lower leg: No edema.  Skin:    General: Skin is warm and dry.  Neurological:     Mental Status: She is alert and oriented to person, place, and time. Mental status is at baseline.  Psychiatric:        Mood and Affect: Mood normal.     ED Results / Procedures /  Treatments   Labs (all labs ordered are listed, but only abnormal results are displayed) Labs Reviewed  COMPREHENSIVE METABOLIC PANEL - Abnormal; Notable for the following components:      Result Value   Potassium 3.4 (*)    All other components within normal limits  LIPASE, BLOOD  URINALYSIS, ROUTINE W REFLEX MICROSCOPIC  CBC WITH DIFFERENTIAL/PLATELET    EKG None  Radiology CT ABDOMEN PELVIS W CONTRAST Result Date: 01/08/2024 CLINICAL DATA:  rlq abdominal pain EXAM: CT ABDOMEN AND PELVIS WITH CONTRAST TECHNIQUE: Multidetector CT imaging of the abdomen and pelvis was performed using the standard protocol following bolus administration of intravenous contrast. RADIATION DOSE REDUCTION: This exam was performed according to the departmental dose-optimization program which includes automated exposure control, adjustment of the mA and/or kV according to patient size and/or use of iterative reconstruction technique. CONTRAST:  80mL OMNIPAQUE  IOHEXOL  300 MG/ML  SOLN COMPARISON:  None Available. FINDINGS: Lower chest: Bilateral lower lobe atelectasis Hepatobiliary: No focal liver abnormality. Calcified gallstone noted within the gallbladder lumen. No gallbladder wall thickening or pericholecystic fluid. No biliary dilatation. Pancreas: No focal lesion. Normal pancreatic contour. No surrounding inflammatory changes. No main pancreatic ductal dilatation. Spleen: Normal in size without focal abnormality. Adrenals/Urinary Tract: No adrenal nodule bilaterally. Bilateral kidneys enhance symmetrically. Hypodense lesion likely represents a simple renal cyst. Simple renal cysts, in the absence of clinically indicated signs/symptoms, require no independent follow-up. No hydronephrosis. No hydroureter.  No nephroureterolithiasis. The urinary bladder is unremarkable. Stomach/Bowel: Stomach is within normal limits. No evidence of bowel wall thickening or dilatation. Appendix appears normal. Vascular/Lymphatic: No  abdominal aorta or iliac aneurysm. Mild atherosclerotic plaque of the aorta and its branches. No abdominal, pelvic, or inguinal lymphadenopathy. Reproductive: Status post hysterectomy. No adnexal masses. Other: No intraperitoneal free fluid. No intraperitoneal free gas. No organized fluid collection. Musculoskeletal: No abdominal wall hernia or abnormality. No suspicious lytic or blastic osseous lesions. No acute displaced fracture. IMPRESSION: 1. Cholelithiasis no CT evidence of acute cholecystitis. 2.  Aortic Atherosclerosis (ICD10-I70.0). Electronically Signed   By: Morgane  Naveau M.D.   On: 01/08/2024 18:42   US  Abdomen Limited RUQ (LIVER/GB) Result Date: 01/08/2024 CLINICAL DATA:  Lower abdominal pain with nausea and vomiting for the past week. EXAM: ULTRASOUND ABDOMEN LIMITED RIGHT UPPER QUADRANT COMPARISON:  CT abdomen pelvis dated January 04, 2024. FINDINGS: Gallbladder: Multiple gallstones. No wall thickening visualized. No sonographic Murphy sign noted by sonographer. Common bile duct: Diameter: 2 mm, normal. Liver: No focal lesion identified. Within normal limits in parenchymal echogenicity. Portal vein is patent  on color Doppler imaging with normal direction of blood flow towards the liver. Other: None. IMPRESSION: 1. Cholelithiasis without evidence of acute cholecystitis. Electronically Signed   By: Elsie ONEIDA Shoulder M.D.   On: 01/08/2024 17:44    Procedures Procedures    Medications Ordered in ED Medications  ketorolac  (TORADOL ) 15 MG/ML injection 15 mg (15 mg Intravenous Given 01/08/24 1740)  ondansetron  (ZOFRAN ) injection 4 mg (4 mg Intravenous Given 01/08/24 1740)  iohexol  (OMNIPAQUE ) 300 MG/ML solution 80 mL (80 mLs Intravenous Contrast Given 01/08/24 1819)    ED Course/ Medical Decision Making/ A&P                                 Medical Decision Making Amount and/or Complexity of Data Reviewed Labs: ordered. Radiology: ordered.  Risk Prescription drug management.   SHAKEISHA HORINE is a 43 y.o. female with comorbidities that complicate the patient evaluation including heroin use, hypertension, hyperlipidemia, and marijuana use presents emergency department with abdominal pain.   Initial Ddx:  Biliary colic, cholecystitis, appendicitis, ovarian pathology, PID  MDM/Course:  Patient resents emergency department with right-sided abdominal pain.  She has been having this off and on for several weeks.  Appears to be postprandial.  I suspect that is likely due to biliary colic.  With her pain will obtain ultrasound to rule out cholecystitis.  Does also have a note from her primary doctor requesting a CT scan be performed today to rule out appendicitis.  This also help image her ovaries.  Has had hysterectomy so PID highly unlikely.  Her pain and nausea and vomiting could also be due to heroin withdrawals.  Patient's pain was treated with Toradol  and Zofran .  Reported that her symptoms improved some.  Ultrasound with gallstones but no signs of cholecystitis.  CT scan without appendicitis or ovarian pathology.  Patient instructed to take Tylenol  and ibuprofen  for pain.  Instructed to follow-up with general surgery to see if she needs a cholecystectomy for biliary colic.  This patient presents to the ED for concern of complaints listed in HPI, this involves an extensive number of treatment options, and is a complaint that carries with it a high risk of complications and morbidity. Disposition including potential need for admission considered.  Dispo: DC Home. Return precautions discussed including, but not limited to, those listed in the AVS. Allowed pt time to ask questions which were answered fully prior to dc.  Records reviewed Outpatient Clinic Notes The following labs were independently interpreted: Chemistry and show no acute abnormality I independently reviewed the following imaging with scope of interpretation limited to determining acute life threatening conditions related  to emergency care: CT Abdomen/Pelvis and agree with the radiologist interpretation with the following exceptions: none I personally reviewed and interpreted cardiac monitoring: normal sinus rhythm  I personally reviewed and interpreted the pt's EKG: see above for interpretation  I have reviewed the patients home medications and made adjustments as needed Social Determinants of health:  Heroin use  Portions of this note were generated with Scientist, clinical (histocompatibility and immunogenetics). Dictation errors may occur despite best attempts at proofreading.     Final Clinical Impression(s) / ED Diagnoses Final diagnoses:  Biliary colic    Rx / DC Orders ED Discharge Orders          Ordered    ondansetron  (ZOFRAN -ODT) 4 MG disintegrating tablet  Every 8 hours PRN        01/08/24  1851              Yolande Lamar BROCKS, MD 01/09/24 1328

## 2024-01-08 NOTE — ED Notes (Signed)
 Provider at bedside attempting USIV x2.

## 2024-01-08 NOTE — ED Notes (Signed)
 MD aware the patient is a hard IV access. He is going to do an u/s guided IV.

## 2024-01-08 NOTE — ED Triage Notes (Signed)
 Pt returns for RLQ pain x more than 2 wks; had full w/u on 2/5, +nausea

## 2024-01-08 NOTE — Discharge Instructions (Signed)
 You were seen for your abdominal pain in the emergency department.  It may be from your gallbladder (biliary colic).  At home, please take Tylenol  and ibuprofen  for your pain.  Take the Zofran  for nausea or vomiting.    Check your MyChart online for the results of any tests that had not resulted by the time you left the emergency department.   Follow-up with general surgery about having her gallbladder removed.  Return immediately to the emergency department if you experience any of the following: Worsening pain, fevers, vomiting, or any other concerning symptoms.    Thank you for visiting our Emergency Department. It was a pleasure taking care of you today.
